# Patient Record
Sex: Female | Born: 1940 | Race: White | Hispanic: No | Marital: Married | Smoking: Former smoker
Health system: Southern US, Community
[De-identification: ages and names within clinical notes are randomized; demographics above are authoritative.]

## PROBLEM LIST (undated history)

## (undated) DIAGNOSIS — F329 Major depressive disorder, single episode, unspecified: Secondary | ICD-10-CM

## (undated) DIAGNOSIS — R011 Cardiac murmur, unspecified: Secondary | ICD-10-CM

## (undated) DIAGNOSIS — I499 Cardiac arrhythmia, unspecified: Secondary | ICD-10-CM

## (undated) DIAGNOSIS — E119 Type 2 diabetes mellitus without complications: Secondary | ICD-10-CM

## (undated) DIAGNOSIS — M1990 Unspecified osteoarthritis, unspecified site: Secondary | ICD-10-CM

## (undated) DIAGNOSIS — I4891 Unspecified atrial fibrillation: Secondary | ICD-10-CM

## (undated) DIAGNOSIS — F32A Depression, unspecified: Secondary | ICD-10-CM

## (undated) DIAGNOSIS — K589 Irritable bowel syndrome without diarrhea: Secondary | ICD-10-CM

## (undated) DIAGNOSIS — IMO0001 Reserved for inherently not codable concepts without codable children: Secondary | ICD-10-CM

## (undated) DIAGNOSIS — I10 Essential (primary) hypertension: Secondary | ICD-10-CM

## (undated) DIAGNOSIS — I2510 Atherosclerotic heart disease of native coronary artery without angina pectoris: Secondary | ICD-10-CM

## (undated) HISTORY — PX: JOINT REPLACEMENT: SHX530

## (undated) HISTORY — PX: SHOULDER ARTHROSCOPY WITH ROTATOR CUFF REPAIR: SHX5685

## (undated) HISTORY — PX: CORONARY ANGIOPLASTY: SHX604

## (undated) HISTORY — PX: EYE SURGERY: SHX253

---

## 2003-10-27 ENCOUNTER — Other Ambulatory Visit: Payer: Self-pay

## 2003-10-30 ENCOUNTER — Other Ambulatory Visit: Payer: Self-pay

## 2003-11-12 ENCOUNTER — Other Ambulatory Visit: Payer: Self-pay

## 2004-03-22 ENCOUNTER — Ambulatory Visit: Payer: Self-pay | Admitting: Family Medicine

## 2004-03-25 ENCOUNTER — Ambulatory Visit: Payer: Self-pay | Admitting: Family Medicine

## 2004-08-12 ENCOUNTER — Ambulatory Visit: Payer: Self-pay | Admitting: Family Medicine

## 2004-10-14 ENCOUNTER — Ambulatory Visit: Payer: Self-pay | Admitting: General Practice

## 2004-11-19 ENCOUNTER — Other Ambulatory Visit: Payer: Self-pay

## 2004-11-22 ENCOUNTER — Ambulatory Visit: Payer: Self-pay | Admitting: General Practice

## 2005-08-22 ENCOUNTER — Ambulatory Visit: Payer: Self-pay | Admitting: Urology

## 2005-09-05 ENCOUNTER — Ambulatory Visit: Payer: Self-pay | Admitting: Family Medicine

## 2006-09-14 ENCOUNTER — Ambulatory Visit: Payer: Self-pay | Admitting: Family Medicine

## 2007-09-18 ENCOUNTER — Ambulatory Visit: Payer: Self-pay | Admitting: Family Medicine

## 2008-01-04 ENCOUNTER — Ambulatory Visit: Payer: Self-pay | Admitting: Family Medicine

## 2008-09-23 ENCOUNTER — Ambulatory Visit: Payer: Self-pay | Admitting: Family Medicine

## 2009-10-16 ENCOUNTER — Ambulatory Visit: Payer: Self-pay | Admitting: Unknown Physician Specialty

## 2009-11-26 ENCOUNTER — Ambulatory Visit: Payer: Self-pay | Admitting: Family Medicine

## 2010-01-14 ENCOUNTER — Ambulatory Visit: Payer: Self-pay | Admitting: Unknown Physician Specialty

## 2010-01-20 ENCOUNTER — Ambulatory Visit: Payer: Self-pay | Admitting: Cardiovascular Disease

## 2010-01-21 ENCOUNTER — Ambulatory Visit: Payer: Self-pay | Admitting: Unknown Physician Specialty

## 2010-04-29 ENCOUNTER — Other Ambulatory Visit: Payer: Self-pay

## 2010-12-14 ENCOUNTER — Ambulatory Visit: Payer: Self-pay | Admitting: Family Medicine

## 2011-07-28 ENCOUNTER — Ambulatory Visit: Payer: Self-pay | Admitting: Unknown Physician Specialty

## 2011-08-15 ENCOUNTER — Ambulatory Visit: Payer: Self-pay | Admitting: Unknown Physician Specialty

## 2011-08-22 LAB — PATHOLOGY REPORT

## 2011-12-19 ENCOUNTER — Ambulatory Visit: Payer: Self-pay | Admitting: Family Medicine

## 2012-02-02 ENCOUNTER — Ambulatory Visit: Payer: Self-pay | Admitting: Unknown Physician Specialty

## 2012-02-17 ENCOUNTER — Ambulatory Visit: Payer: Self-pay | Admitting: Unknown Physician Specialty

## 2012-07-03 ENCOUNTER — Ambulatory Visit: Payer: Self-pay | Admitting: Specialist

## 2012-11-14 ENCOUNTER — Ambulatory Visit: Payer: Self-pay | Admitting: Specialist

## 2012-12-19 ENCOUNTER — Ambulatory Visit: Payer: Self-pay | Admitting: Family Medicine

## 2013-05-20 ENCOUNTER — Ambulatory Visit: Payer: Self-pay | Admitting: Specialist

## 2013-06-05 ENCOUNTER — Ambulatory Visit: Payer: Self-pay | Admitting: General Practice

## 2013-06-05 LAB — CBC
HCT: 42.5 % (ref 35.0–47.0)
HGB: 13.9 g/dL (ref 12.0–16.0)
MCH: 27.9 pg (ref 26.0–34.0)
MCHC: 32.8 g/dL (ref 32.0–36.0)
MCV: 85 fL (ref 80–100)
Platelet: 251 10*3/uL (ref 150–440)
RBC: 4.99 10*6/uL (ref 3.80–5.20)
RDW: 16.2 % — ABNORMAL HIGH (ref 11.5–14.5)
WBC: 11.7 10*3/uL — ABNORMAL HIGH (ref 3.6–11.0)

## 2013-06-05 LAB — SEDIMENTATION RATE: ERYTHROCYTE SED RATE: 15 mm/h (ref 0–30)

## 2013-06-05 LAB — URINALYSIS, COMPLETE
BLOOD: NEGATIVE
Bacteria: NONE SEEN
Bilirubin,UR: NEGATIVE
Glucose,UR: NEGATIVE mg/dL (ref 0–75)
Ketone: NEGATIVE
LEUKOCYTE ESTERASE: NEGATIVE
Nitrite: NEGATIVE
Ph: 7 (ref 4.5–8.0)
Protein: NEGATIVE
RBC, UR: NONE SEEN /HPF (ref 0–5)
SQUAMOUS EPITHELIAL: NONE SEEN
Specific Gravity: 1.009 (ref 1.003–1.030)

## 2013-06-05 LAB — PROTIME-INR
INR: 2
Prothrombin Time: 22.6 secs — ABNORMAL HIGH (ref 11.5–14.7)

## 2013-06-05 LAB — BASIC METABOLIC PANEL
Anion Gap: 4 — ABNORMAL LOW (ref 7–16)
BUN: 14 mg/dL (ref 7–18)
CALCIUM: 9.5 mg/dL (ref 8.5–10.1)
CO2: 29 mmol/L (ref 21–32)
Chloride: 101 mmol/L (ref 98–107)
Creatinine: 0.8 mg/dL (ref 0.60–1.30)
Glucose: 99 mg/dL (ref 65–99)
Osmolality: 269 (ref 275–301)
Potassium: 4.3 mmol/L (ref 3.5–5.1)
Sodium: 134 mmol/L — ABNORMAL LOW (ref 136–145)

## 2013-06-05 LAB — MRSA PCR SCREENING

## 2013-06-05 LAB — APTT: Activated PTT: 44.1 secs — ABNORMAL HIGH (ref 23.6–35.9)

## 2013-06-07 LAB — URINE CULTURE

## 2013-06-19 ENCOUNTER — Inpatient Hospital Stay: Payer: Self-pay | Admitting: General Practice

## 2013-06-19 LAB — PROTIME-INR
INR: 1.2
Prothrombin Time: 15.2 secs — ABNORMAL HIGH (ref 11.5–14.7)

## 2013-06-20 LAB — PLATELET COUNT: Platelet: 156 10*3/uL (ref 150–440)

## 2013-06-20 LAB — PROTIME-INR
INR: 1.3
PROTHROMBIN TIME: 15.9 s — AB (ref 11.5–14.7)

## 2013-06-20 LAB — BASIC METABOLIC PANEL
Anion Gap: 2 — ABNORMAL LOW (ref 7–16)
BUN: 12 mg/dL (ref 7–18)
CALCIUM: 8.2 mg/dL — AB (ref 8.5–10.1)
CO2: 26 mmol/L (ref 21–32)
Chloride: 104 mmol/L (ref 98–107)
Creatinine: 0.8 mg/dL (ref 0.60–1.30)
EGFR (African American): >60
EGFR (Non-African Amer.): >60
Glucose: 97 mg/dL (ref 65–99)
Osmolality: 264 (ref 275–301)
Potassium: 4 mmol/L (ref 3.5–5.1)
SODIUM: 132 mmol/L — AB (ref 136–145)

## 2013-06-20 LAB — HEMOGLOBIN: HGB: 11.8 g/dL — ABNORMAL LOW (ref 12.0–16.0)

## 2013-06-21 LAB — PROTIME-INR
INR: 2.4
Prothrombin Time: 25.8 secs — ABNORMAL HIGH (ref 11.5–14.7)

## 2013-06-21 LAB — BASIC METABOLIC PANEL
Anion Gap: 5 — ABNORMAL LOW (ref 7–16)
BUN: 11 mg/dL (ref 7–18)
CO2: 24 mmol/L (ref 21–32)
CREATININE: 0.88 mg/dL (ref 0.60–1.30)
Calcium, Total: 8.4 mg/dL — ABNORMAL LOW (ref 8.5–10.1)
Chloride: 102 mmol/L (ref 98–107)
EGFR (African American): >60
GLUCOSE: 119 mg/dL — AB (ref 65–99)
OSMOLALITY: 263 (ref 275–301)
Potassium: 4.3 mmol/L (ref 3.5–5.1)
Sodium: 131 mmol/L — ABNORMAL LOW (ref 136–145)

## 2013-06-21 LAB — PLATELET COUNT: PLATELETS: 158 10*3/uL (ref 150–440)

## 2013-06-21 LAB — HEMOGLOBIN: HGB: 11.3 g/dL — ABNORMAL LOW (ref 12.0–16.0)

## 2013-06-22 LAB — BASIC METABOLIC PANEL
Anion Gap: 4 — ABNORMAL LOW (ref 7–16)
BUN: 9 mg/dL (ref 7–18)
CALCIUM: 9.2 mg/dL (ref 8.5–10.1)
CO2: 29 mmol/L (ref 21–32)
Chloride: 102 mmol/L (ref 98–107)
Creatinine: 0.77 mg/dL (ref 0.60–1.30)
EGFR (Non-African Amer.): >60
Glucose: 107 mg/dL — ABNORMAL HIGH (ref 65–99)
OSMOLALITY: 269 (ref 275–301)
POTASSIUM: 3.5 mmol/L (ref 3.5–5.1)
Sodium: 135 mmol/L — ABNORMAL LOW (ref 136–145)

## 2013-06-22 LAB — URINALYSIS, COMPLETE
BLOOD: NEGATIVE
Bacteria: NONE SEEN
Bilirubin,UR: NEGATIVE
Glucose,UR: NEGATIVE mg/dL (ref 0–75)
Ketone: NEGATIVE
Leukocyte Esterase: NEGATIVE
Nitrite: NEGATIVE
Ph: 7 (ref 4.5–8.0)
Protein: NEGATIVE
Specific Gravity: 1.004 (ref 1.003–1.030)
Squamous Epithelial: <1

## 2013-06-22 LAB — CK-MB
CK-MB: 2 ng/mL (ref 0.5–3.6)
CK-MB: 1.6 ng/mL (ref 0.5–3.6)
CK-MB: 1.5 ng/mL (ref 0.5–3.6)

## 2013-06-22 LAB — TROPONIN I
Troponin-I: 0.37 ng/mL — ABNORMAL HIGH
Troponin-I: 0.38 ng/mL — ABNORMAL HIGH
Troponin-I: 0.38 ng/mL — ABNORMAL HIGH

## 2013-06-22 LAB — PROTIME-INR
INR: 2.8
Prothrombin Time: 29.1 secs — ABNORMAL HIGH (ref 11.5–14.7)

## 2013-06-23 LAB — PROTIME-INR
INR: 2.5
PROTHROMBIN TIME: 26.4 s — AB (ref 11.5–14.7)

## 2013-06-23 LAB — BASIC METABOLIC PANEL
Anion Gap: 4 — ABNORMAL LOW (ref 7–16)
BUN: 11 mg/dL (ref 7–18)
CALCIUM: 9 mg/dL (ref 8.5–10.1)
CREATININE: 0.8 mg/dL (ref 0.60–1.30)
Chloride: 98 mmol/L (ref 98–107)
Co2: 34 mmol/L — ABNORMAL HIGH (ref 21–32)
EGFR (African American): >60
Glucose: 99 mg/dL (ref 65–99)
Osmolality: 271 (ref 275–301)
POTASSIUM: 3.4 mmol/L — AB (ref 3.5–5.1)
Sodium: 136 mmol/L (ref 136–145)

## 2013-06-23 LAB — URINE CULTURE

## 2013-12-23 ENCOUNTER — Ambulatory Visit: Payer: Self-pay | Admitting: Family Medicine

## 2014-01-23 DIAGNOSIS — R918 Other nonspecific abnormal finding of lung field: Secondary | ICD-10-CM | POA: Insufficient documentation

## 2014-02-12 ENCOUNTER — Ambulatory Visit: Payer: Self-pay | Admitting: Specialist

## 2014-02-18 DIAGNOSIS — I2510 Atherosclerotic heart disease of native coronary artery without angina pectoris: Secondary | ICD-10-CM | POA: Insufficient documentation

## 2014-02-18 DIAGNOSIS — I10 Essential (primary) hypertension: Secondary | ICD-10-CM | POA: Insufficient documentation

## 2014-02-18 DIAGNOSIS — E785 Hyperlipidemia, unspecified: Secondary | ICD-10-CM | POA: Insufficient documentation

## 2014-02-18 DIAGNOSIS — I4891 Unspecified atrial fibrillation: Secondary | ICD-10-CM | POA: Insufficient documentation

## 2014-03-19 DIAGNOSIS — E119 Type 2 diabetes mellitus without complications: Secondary | ICD-10-CM | POA: Insufficient documentation

## 2014-09-01 DIAGNOSIS — Z8659 Personal history of other mental and behavioral disorders: Secondary | ICD-10-CM | POA: Insufficient documentation

## 2014-09-13 NOTE — Op Note (Signed)
PATIENT NAME:  [name] [name], [name] [name] MR#:  [id number] DATE OF BIRTH:  10-14-1940  DATE OF PROCEDURE:  06/19/2013  PREOPERATIVE DIAGNOSIS: Degenerative arthrosis of the right knee.   POSTOPERATIVE DIAGNOSIS: Degenerative arthrosis of the right knee.   PROCEDURE PERFORMED: Right total knee arthroplasty using computer-assisted navigation.   SURGEON: Laurice Record. Hooten, M.D.   ASSISTANT:  Vance Peper, PA (required to maintain retraction throughout the procedure).   ANESTHESIA: Spinal.   ESTIMATED BLOOD LOSS: 50 mL.   FLUIDS REPLACED: 2000 mL of crystalloid.   TOURNIQUET TIME: 88 minutes.   DRAINS: Two medium drains to reinfusion system.   SOFT TISSUE RELEASES: Anterior cruciate ligament, posterior cruciate ligament, deep medial collateral ligament, patellofemoral ligament and posterolateral corner.   IMPLANTS UTILIZED: DePuy PFC Sigma size 4N (narrow) posterior stabilized femoral component (cemented), size 3 MBT tibial component (cemented), 35 mm 3 peg oval dome patella (cemented) and a 10 mm stabilized rotating platform polyethylene insert.   INDICATIONS FOR SURGERY: The patient is a 74 year old female who has been seen for complaints of progressive right knee pain. X-rays demonstrated severe degenerative changes in tricompartmental fashion with relative valgus deformity. After discussion of the risks and benefits of surgical intervention, the patient expressed understanding of the risks, benefits and agreed with plans for surgical intervention.   PROCEDURE IN DETAIL: The patient was brought to the operating room, and after adequate spinal anesthesia was achieved, a tourniquet was placed on the patient's upper right thigh. The patient's right knee and leg were cleaned and prepped with alcohol and DuraPrep and draped in the usual sterile fashion. A "timeout" was performed as per usual protocol. The right lower extremity was exsanguinated using an Esmarch, and the tourniquet was inflated to 300 mmHg. An  anterior longitudinal incision was made followed by a standard mid vastus approach. A small effusion was evacuated. The deep fibers of the medial collateral ligament were elevated in a subperiosteal fashion off the medial flare of the tibia so as to maintain a continuous soft tissue sleeve. The patella was subluxed laterally and the patellofemoral ligament was incised. Inspection of the knee demonstrates severe degenerative changes, most notably to the lateral compartment, as well as to the patellofemoral articulation. Prominent osteophytes were debrided using a rongeur. Anterior and posterior cruciate ligaments were excised. Two 4.0 mm Schanz pins were inserted into the femur and into the tibia for attachment of the ray of trackers used for computer-assisted navigation. Hip center was identified using circumduction technique. Distal landmarks were mapped using the computer. The distal femur and proximal tibia were mapped using the computer. Distal femoral cutting guide was positioned using computer-assisted navigation so as to achieve a 5-degree distal valgus cut. Cut was performed and verified using the computer. Distal femur was sized and it was felt that a size 4N (narrow) was appropriate. A size 4 cutting guide was positioned, and the anterior cut was performed and verified using the computer. This was followed by completion of the posterior and chamfer cuts. Femoral cutting guide for central box was then positioned, and the central box cut was performed. Attention was then directed to the proximal tibia. Medial and lateral menisci were excised. The extramedullary tibial cutting guide was positioned using computer-assisted navigation so as to achieve a 0-degree varus/valgus alignment with 0 degree posterior slope. Cut was performed and verified using the computer. The proximal tibia was sized and it was felt that a size 3 tibial tray was appropriate. Tibial and femoral trials were inserted followed by insertion  of a 10 mm polyethylene insert. The knee was felt to be tight laterally. Trial components were removed. Posterior osteophytes were debrided off of the femur. The knee was brought into full extension and distracted using Moreland retractors. Posterolateral corner was carefully released using a combination of electrocautery and Metzenbaum scissors. This allowed for good medial and lateral soft tissue balancing. Trial components were reinserted following insertion of a 10 mm polyethylene insert. Medial and lateral soft tissue balance was appreciated in full extension and in flexion. Finally, the patella was cut and repaired so as to accommodate a 35 mm 3 peg oval dome patella. Patellar trial was placed, and the knee was placed through a range of motion with excellent patellar tracking appreciated. The femoral trial was removed. Central post hole for the tibial component was reamed followed by insertion of a keel punch. Tibial trial was then removed. The cut surfaces of bone were irrigated with copious amounts of normal saline with antibiotic solution using pulsatile lavage and then suctioned dry. Polymethylmethacrylate cement with gentamicin was prepared in the usual fashion using a vacuum mixer. Gentamicin cement was utilized due to the patient's history of diabetes. Cement was applied to the cut surface of the proximal tibia, as well as along the undersurface of a size 3 MBT tibial component. The tibial component was positioned and impacted into place. Excess cement was removed using [employer] fast streamer. Cement was then applied to the cut surfaces of the femur, as well as along the posterior flanges of a size 4N (narrow) posterior stabilized femoral component. Femoral component was positioned and impacted into place. Excess cement was removed using [employer] fast streamer. A 10 mm polyethylene trial was inserted, and the knee was brought in full extension with steady axial compression applied. Finally, a 35 mm 3 peg oval dome  patella was positioned after placement of cement along the backside of the patella. A patellar clamp was applied. Excess cement was removed using [employer] fast streamer.   After adequate curing of cement, the tourniquet was deflated after a total tourniquet time of 88 minutes. Hemostasis was achieved using electrocautery. The knee was irrigated with copious amounts of normal saline with antibiotic solution using pulsatile lavage and then suctioned dry. The knee was inspected for any residual cement debris. Then, 20 mL of 1.3% Exparel and 40 mL of normal saline was injected along the posterior capsule, medial and lateral gutters  and along the arthrotomy site. A 10 mm stabilized rotating platform polyethylene insert was inserted. Again, excellent medial and lateral soft tissue balancing was appreciated in extension and in flexion. Excellent patellar tracking was appreciated. Two medium drains were placed in the wound bed and brought out through stab incisions to be attached to a reinfusion system. The medial parapatellar portion of the incision was reapproximated using interrupted sutures of #1 Vicryl. The subcutaneous tissue was approximated in layers using first #0 Vicryl followed by 2-0 Vicryl. Then, 30 mL of 0.25% Marcaine with epinephrine was injected in the subcutaneous tissue along the incision line. Skin was then closed with skin staples. A sterile dressing was applied.   The patient tolerated the procedure well. She was transported to the recovery room in stable condition.     ____________________________ Laurice Record. Holley Bouche., MD jph:dmm D: 06/19/2013 11:25:44 ET T: 06/19/2013 11:48:03 ET JOB#: [id number]  cc: Jeneen Rinks P. Holley Bouche., MD, <Dictator> Laurice Record Holley Bouche MD ELECTRONICALLY SIGNED 07/12/2013 6:39

## 2014-09-13 NOTE — Consult Note (Signed)
PATIENT NAME:  [name] [name], [name] [name] MR#:  [id number] DATE OF BIRTH:  [birth year]  DATE OF CONSULTATION:  06/22/2013  REFERRING PHYSICIAN:  Laurice Record. Holley Bouche., MD CONSULTING PHYSICIAN:  Tama High III, MD  REASON FOR CONSULTATION: Hypoxia.   HISTORY OF PRESENT ILLNESS: A 74 year old female with history of coronary artery disease, atrial fibrillation, hypertension, hyperlipidemia, who underwent right knee replacement on 06/19/2013. She has done well postoperatively, with some pain and confusion that has resolved, really not requiring a lot of pain medication currently. Today, she was noted to have hypoxia, and attempts were made to take her off oxygen; however, she needed to return to this device, and so consultation is made for further evaluation. The patient reports that she has episodes of shortness of breath, usually with exertion, usually improves after 15 to 20 minutes of sitting. She had one of these episodes a few days ago, but it does not appear that she told anyone. She uses an inhaler at home, but denies any recent wheezing. Review of her chart shows pulmonary function tests were performed in October 2013, which were normal. She also had stress testing done last year. She has some palpitations normally due to her atrial fibrillation, but there has been no worsening of this. She denies significant chest pain. Does have some edema and some discomfort in her leg, but not severe. She is talking easily in complete sentences currently.   PAST MEDICAL HISTORY:  1. Hypertension.  2. Atrial fibrillation.  3. Coronary artery disease with prior LAD stenting in 2002. 4. Hyperlipidemia.  5. Osteoarthritis.  6. Anxiety with depression.  7. Irritable bowel syndrome.  8. Gastroesophageal reflux disease with history of H. pylori.  9. Diabetes mellitus.  10. History of urethral stricture.  11. Bilateral tubal ligations.  12. History of cataract repair.  13. History of rotator cuff surgery.  14. Right  knee replacement on 06/19/2013.   ALLERGIES: TETRACYCLINE, FLAGYL, PHENERGAN, SEPTRA, STADOL, SULFA MEDICATIONS, VICODIN AND ROCEPHIN.   HOME MEDICATIONS:   1. Crestor 10 mg p.o. daily.  2. Cardizem CD 240 mg p.o. daily.  3. Albuterol MDI 2 puffs q.6 hours as needed.  4. Coumadin 5 mg p.o. daily.  5. Omeprazole 20 mg p.o. daily.  6. Ambien 10 mg p.o. at bedtime as needed for sleep.  7. Valium 5 mg p.o. daily as needed.  8. Digoxin 0.125 mg 1/2-tablet p.o. daily.  9. Lasix 20 mg p.o. daily.  10. Lexapro 20 mg p.o. daily.  11. Potassium 20 mEq p.o. daily.   SOCIAL HISTORY: No alcohol or tobacco.   FAMILY HISTORY: Diabetes, hypertension.   REVIEW OF SYSTEMS: Please see HPI. No vision changes; no dysphagia; no current chest pain; no abdominal discomfort. Remainder of complete review of systems is negative.   PHYSICAL EXAMINATION:  VITAL SIGNS: Temperature 99.4, pulse 105, blood pressure 145/82, saturation 91% on 2 liters.  GENERAL: Well-developed, well-nourished female, in no distress.  EYES: Pupils round and reactive to light. Lids and conjunctivae are unremarkable.  EARS, NOSE AND THROAT: Unremarkable. The oropharynx is moist without lesions.  NECK: Supple. Trachea midline. No thyromegaly.  CARDIOVASCULAR: Irregularly irregular without gallops or rubs. Carotid and radial pulses 2+.  LUNGS: Crackles are heard in the lower lung fields bilaterally, without wheeze or retractions. Respiratory rate 12.  ABDOMEN: Soft, nontender, nondistended. Positive bowel sounds. No guard or rebound.  EXTREMITIES: Postoperative changes in the right knee. 1+ edema in the right lower leg with negative Homans. No significant edema in  the left leg.  NEUROLOGIC: Cranial nerves intact. Motor strength appears to be symmetrical except as limited by the recent right leg surgery.   DATA: Chest x-ray reveals cardiomegaly with pulmonary vascular congestion. BUN 9, creatinine 0.77, glucose 107, sodium 135. INR is  2.8. Hemoglobin 11.3.   IMPRESSION AND PLAN:  1. Hypoxia. Appears to be most consistent with some pulmonary edema. Would get CT scan to rule out pulmonary embolism given recent surgery; however, this appears to be less likely given her resumption of anticoagulation, which is at target. This should confirm presence of any pulmonary edema or any other source of pathology which would be interfering with her oxygen levels. Will increase diuretic. Would get echo to determine whether LV function is reduced. Had stress testing in 2014; however, if she has any further episodes or worsening, may need to consider repeating this. Recheck her cardiac enzymes. Follow electrolytes.  2. Hypertension. Borderline currently, will see how she does with diuresis, consideration for titration upward of the Cardizem. Consideration for ACE or ARB pending results of the echocardiogram.  3. Atrial fibrillation, rate controlled. Continue Coumadin.  4. Dyspepsia. Continue proton pump inhibitors. This appears to be stable. 5. Diabetes mellitus. Coverage with sliding scale if needed.   Thank you for allowing me to participate in the care of Ms. Mistry, and I will follow with you during this hospitalization.   ____________________________ Adin Hector, MD bjk:lb D: 06/22/2013 11:37:08 ET T: 06/22/2013 11:59:44 ET JOB#: [id number]  cc: Tama High III, MD, <Dictator> Ramonita Lab MD ELECTRONICALLY SIGNED 06/24/2013 20:44

## 2014-09-13 NOTE — Consult Note (Signed)
Brief Consult Note: Diagnosis: pulmonary edema.   Patient was seen by consultant.   Consult note dictated.   Orders entered.   Discussed with Attending MD.   Comments: Exam with bibasilar crackles; CXR suggests edema.  Given surgery, would get CT to r/o PE, but fairly low likelihood of this, given current INR.  Check echo to eval EF; increase diuretic.  Electronic Signatures: Tama High (MD)  (Signed 31-Jan-15 11:30)  Authored: Brief Consult Note   Last Updated: 31-Jan-15 11:30 by Tama High (MD)

## 2014-09-13 NOTE — Discharge Summary (Signed)
PATIENT NAME:  [name], [name] MR#:  [id number] DATE OF BIRTH:  [birth year]  DATE OF ADMISSION:  06/19/2013 DATE OF DISCHARGE:  06/23/2013  OPERATION: The patient on 06/19/2013, had a right total knee arthroplasty using computer-aided navigation.   PREOPERATIVE DIAGNOSIS:  Degenerative arthrosis of the right knee.   DISCHARGE DIAGNOSIS: Degenerative arthritis of the right knee.   SURGEON: Skip Estimable, M.D.   ASSISTANT: Vance Peper, PA.   ANESTHESIA: Spinal.   ESTIMATED BLOOD LOSS: 50 mL  REPLACEMENT:  2000 mL of crystalloid.  IMPLANTS USED:  DePuy PFC Sigma size 4 narrow, posterior stabilized femoral component (cemented), size 3 MBT tibial component (cemented), 35 mm 3-peg oval dome patella that was cemented, a 10 mm stabilized rotating platform polyethylene insert.   HISTORY OF PRESENT ILLNESS:  The patient is a 74 year old female who presented for an upcoming total knee replacement on the right knee. The patient has a long history of progressive pain and deformity of the knee. The patient has been refractory to conservative treatment. The patient is on Coumadin presently.   PHYSICAL EXAMINATION: GENERAL: Alert female with an antalgic gait and varus thrust to the right knee.  LUNGS: Clear to auscultation.  CARDIOVASCULAR: Irregular rate and rhythm with no murmur.  MUSCULOSKELETAL: In regard to the right knee, the patient does have positive soft tissue swelling. The patient has medial and lateral joint line tenderness. The patient has full extension of 112 degrees flexion.  NEUROLOGIC: Essentially intact.   HOSPITAL COURSE: The patient after initial admission after surgery had her INRs followed. It was initially 1.2 and her hemoglobin was 11.3, which remained stable there. The patient did physical therapy and did well with ambulation, ambulating initially bed to chair and progressing up to ambulating around the nurse's station. The patient was able to do straight leg raise.   DISCHARGE  INSTRUCTIONS: The patient will do weight bear as tolerated on the affected leg. The patient to use thigh-high TED hose on both legs, remove at bedtime. The patient will elevate her heels off the bed and be encouraged to do cough and deep breathing. The patient will use a regular diet. The patient will use Polar Care to decrease swelling and not to get her dressing wet or dirty and try to leave it on. The patient will have it changed on a p.r.n. basis with therapy. The patient will do physical therapy at home doing gait training and range of motion activities. The patient will call the clinic if there is any bright red bleeding, calf pain or bowel/bladder difficulty and any fever greater than 101.5. The patient is to follow up at [facility] on 07/04/2013.   DISCHARGE MEDICATIONS: The patient is to resume home medications and to add oxycodone for breakthrough pain.  ____________________________ J. Reche Dixon, [place] jtm:ce D: 07/05/2013 14:38:41 ET T: 07/05/2013 15:25:14 ET JOB#: [id number]  cc: J. Reche Dixon, [place], <Dictator> J Ja Pistole [facility] PA ELECTRONICALLY SIGNED 07/11/2013 7:13

## 2015-01-01 ENCOUNTER — Encounter: Payer: Self-pay | Admitting: *Deleted

## 2015-01-01 DIAGNOSIS — R002 Palpitations: Secondary | ICD-10-CM | POA: Diagnosis not present

## 2015-01-01 DIAGNOSIS — M8580 Other specified disorders of bone density and structure, unspecified site: Secondary | ICD-10-CM | POA: Diagnosis not present

## 2015-01-01 DIAGNOSIS — Z87891 Personal history of nicotine dependence: Secondary | ICD-10-CM | POA: Diagnosis not present

## 2015-01-01 DIAGNOSIS — Z955 Presence of coronary angioplasty implant and graft: Secondary | ICD-10-CM | POA: Diagnosis not present

## 2015-01-01 DIAGNOSIS — Z881 Allergy status to other antibiotic agents status: Secondary | ICD-10-CM | POA: Diagnosis not present

## 2015-01-01 DIAGNOSIS — I2510 Atherosclerotic heart disease of native coronary artery without angina pectoris: Secondary | ICD-10-CM | POA: Diagnosis not present

## 2015-01-01 DIAGNOSIS — R062 Wheezing: Secondary | ICD-10-CM | POA: Diagnosis not present

## 2015-01-01 DIAGNOSIS — I4891 Unspecified atrial fibrillation: Secondary | ICD-10-CM | POA: Diagnosis not present

## 2015-01-01 DIAGNOSIS — Z888 Allergy status to other drugs, medicaments and biological substances status: Secondary | ICD-10-CM | POA: Diagnosis not present

## 2015-01-01 DIAGNOSIS — E119 Type 2 diabetes mellitus without complications: Secondary | ICD-10-CM | POA: Diagnosis not present

## 2015-01-01 DIAGNOSIS — R011 Cardiac murmur, unspecified: Secondary | ICD-10-CM | POA: Diagnosis not present

## 2015-01-01 DIAGNOSIS — F329 Major depressive disorder, single episode, unspecified: Secondary | ICD-10-CM | POA: Diagnosis not present

## 2015-01-01 DIAGNOSIS — H2511 Age-related nuclear cataract, right eye: Secondary | ICD-10-CM | POA: Diagnosis present

## 2015-01-01 DIAGNOSIS — Z9842 Cataract extraction status, left eye: Secondary | ICD-10-CM | POA: Diagnosis not present

## 2015-01-01 DIAGNOSIS — M1990 Unspecified osteoarthritis, unspecified site: Secondary | ICD-10-CM | POA: Diagnosis not present

## 2015-01-01 DIAGNOSIS — M7989 Other specified soft tissue disorders: Secondary | ICD-10-CM | POA: Diagnosis not present

## 2015-01-01 DIAGNOSIS — K589 Irritable bowel syndrome without diarrhea: Secondary | ICD-10-CM | POA: Diagnosis not present

## 2015-01-01 DIAGNOSIS — R0602 Shortness of breath: Secondary | ICD-10-CM | POA: Diagnosis not present

## 2015-01-01 DIAGNOSIS — I10 Essential (primary) hypertension: Secondary | ICD-10-CM | POA: Diagnosis not present

## 2015-01-01 DIAGNOSIS — E780 Pure hypercholesterolemia: Secondary | ICD-10-CM | POA: Diagnosis not present

## 2015-01-01 DIAGNOSIS — Z96651 Presence of right artificial knee joint: Secondary | ICD-10-CM | POA: Diagnosis not present

## 2015-01-13 ENCOUNTER — Ambulatory Visit: Payer: Commercial Managed Care - HMO | Admitting: Anesthesiology

## 2015-01-13 ENCOUNTER — Ambulatory Visit
Admission: RE | Admit: 2015-01-13 | Discharge: 2015-01-13 | Disposition: A | Discharge status: Home / Self Care | Payer: Commercial Managed Care - HMO | Source: Ambulatory Visit | Attending: Ophthalmology | Admitting: Ophthalmology

## 2015-01-13 ENCOUNTER — Encounter: Payer: Self-pay | Admitting: *Deleted

## 2015-01-13 ENCOUNTER — Encounter
Admission: RE | Disposition: A | Discharge status: Home / Self Care | Payer: Self-pay | Source: Ambulatory Visit | Attending: Ophthalmology

## 2015-01-13 DIAGNOSIS — R011 Cardiac murmur, unspecified: Secondary | ICD-10-CM | POA: Insufficient documentation

## 2015-01-13 DIAGNOSIS — I4891 Unspecified atrial fibrillation: Secondary | ICD-10-CM | POA: Insufficient documentation

## 2015-01-13 DIAGNOSIS — Z87891 Personal history of nicotine dependence: Secondary | ICD-10-CM | POA: Insufficient documentation

## 2015-01-13 DIAGNOSIS — M7989 Other specified soft tissue disorders: Secondary | ICD-10-CM | POA: Insufficient documentation

## 2015-01-13 DIAGNOSIS — Z881 Allergy status to other antibiotic agents status: Secondary | ICD-10-CM | POA: Insufficient documentation

## 2015-01-13 DIAGNOSIS — Z955 Presence of coronary angioplasty implant and graft: Secondary | ICD-10-CM | POA: Insufficient documentation

## 2015-01-13 DIAGNOSIS — H2511 Age-related nuclear cataract, right eye: Secondary | ICD-10-CM | POA: Insufficient documentation

## 2015-01-13 DIAGNOSIS — M1990 Unspecified osteoarthritis, unspecified site: Secondary | ICD-10-CM | POA: Insufficient documentation

## 2015-01-13 DIAGNOSIS — E780 Pure hypercholesterolemia: Secondary | ICD-10-CM | POA: Insufficient documentation

## 2015-01-13 DIAGNOSIS — Z9842 Cataract extraction status, left eye: Secondary | ICD-10-CM | POA: Insufficient documentation

## 2015-01-13 DIAGNOSIS — R062 Wheezing: Secondary | ICD-10-CM | POA: Insufficient documentation

## 2015-01-13 DIAGNOSIS — M8580 Other specified disorders of bone density and structure, unspecified site: Secondary | ICD-10-CM | POA: Insufficient documentation

## 2015-01-13 DIAGNOSIS — Z888 Allergy status to other drugs, medicaments and biological substances status: Secondary | ICD-10-CM | POA: Insufficient documentation

## 2015-01-13 DIAGNOSIS — F329 Major depressive disorder, single episode, unspecified: Secondary | ICD-10-CM | POA: Insufficient documentation

## 2015-01-13 DIAGNOSIS — E119 Type 2 diabetes mellitus without complications: Secondary | ICD-10-CM | POA: Insufficient documentation

## 2015-01-13 DIAGNOSIS — R0602 Shortness of breath: Secondary | ICD-10-CM | POA: Insufficient documentation

## 2015-01-13 DIAGNOSIS — I10 Essential (primary) hypertension: Secondary | ICD-10-CM | POA: Insufficient documentation

## 2015-01-13 DIAGNOSIS — R002 Palpitations: Secondary | ICD-10-CM | POA: Insufficient documentation

## 2015-01-13 DIAGNOSIS — Z96651 Presence of right artificial knee joint: Secondary | ICD-10-CM | POA: Insufficient documentation

## 2015-01-13 DIAGNOSIS — I2510 Atherosclerotic heart disease of native coronary artery without angina pectoris: Secondary | ICD-10-CM | POA: Insufficient documentation

## 2015-01-13 DIAGNOSIS — K589 Irritable bowel syndrome without diarrhea: Secondary | ICD-10-CM | POA: Insufficient documentation

## 2015-01-13 HISTORY — DX: Irritable bowel syndrome without diarrhea: K58.9

## 2015-01-13 HISTORY — DX: Depression, unspecified: F32.A

## 2015-01-13 HISTORY — DX: Essential (primary) hypertension: I10

## 2015-01-13 HISTORY — DX: Atherosclerotic heart disease of native coronary artery without angina pectoris: I25.10

## 2015-01-13 HISTORY — DX: Reserved for inherently not codable concepts without codable children: IMO0001

## 2015-01-13 HISTORY — DX: Cardiac arrhythmia, unspecified: I49.9

## 2015-01-13 HISTORY — PX: CATARACT EXTRACTION W/PHACO: SHX586

## 2015-01-13 HISTORY — DX: Major depressive disorder, single episode, unspecified: F32.9

## 2015-01-13 HISTORY — DX: Cardiac murmur, unspecified: R01.1

## 2015-01-13 HISTORY — DX: Unspecified osteoarthritis, unspecified site: M19.90

## 2015-01-13 HISTORY — DX: Type 2 diabetes mellitus without complications: E11.9

## 2015-01-13 LAB — GLUCOSE, CAPILLARY: Glucose-Capillary: 119 mg/dL — ABNORMAL HIGH (ref 65–99)

## 2015-01-13 SURGERY — PHACOEMULSIFICATION, CATARACT, WITH IOL INSERTION
Anesthesia: Monitor Anesthesia Care | Site: Eye | Laterality: Right | Wound class: Clean

## 2015-01-13 MED ORDER — FENTANYL CITRATE (PF) 100 MCG/2ML IJ SOLN
INTRAMUSCULAR | Status: DC | PRN
  Administered 2015-01-13: 50 ug via INTRAVENOUS
  Administered 2015-01-13: 25 ug via INTRAVENOUS

## 2015-01-13 MED ORDER — MOXIFLOXACIN HCL 0.5 % OP SOLN
OPHTHALMIC | Status: DC | PRN
  Administered 2015-01-13: 1 [drp] via OPHTHALMIC

## 2015-01-13 MED ORDER — SODIUM CHLORIDE 0.9 % IV SOLN
INTRAVENOUS | Status: DC
  Administered 2015-01-13 (×2): via INTRAVENOUS

## 2015-01-13 MED ORDER — BSS IO SOLN
INTRAOCULAR | Status: DC | PRN
  Administered 2015-01-13: 09:00:00 via OPHTHALMIC

## 2015-01-13 MED ORDER — MIDAZOLAM HCL 2 MG/2ML IJ SOLN
INTRAMUSCULAR | Status: DC | PRN
  Administered 2015-01-13: 1 mg via INTRAVENOUS
  Administered 2015-01-13: .5 mg via INTRAVENOUS

## 2015-01-13 MED ORDER — TETRACAINE HCL 0.5 % OP SOLN
1.0000 [drp] | OPHTHALMIC | Status: AC | PRN
  Administered 2015-01-13: 1 [drp] via OPHTHALMIC

## 2015-01-13 MED ORDER — POVIDONE-IODINE 5 % OP SOLN
OPHTHALMIC | Status: AC
  Administered 2015-01-13: 1 via OPHTHALMIC
  Filled 2015-01-13: qty 30

## 2015-01-13 MED ORDER — CEFUROXIME OPHTHALMIC INJECTION 1 MG/0.1 ML
INJECTION | OPHTHALMIC | Status: DC | PRN
  Administered 2015-01-13: 0.1 mL via INTRACAMERAL

## 2015-01-13 MED ORDER — POVIDONE-IODINE 5 % OP SOLN
1.0000 "application " | OPHTHALMIC | Status: AC | PRN
  Administered 2015-01-13: 1 via OPHTHALMIC

## 2015-01-13 MED ORDER — MOXIFLOXACIN HCL 0.5 % OP SOLN
OPHTHALMIC | Status: AC
  Filled 2015-01-13: qty 3

## 2015-01-13 MED ORDER — ONDANSETRON HCL 4 MG/2ML IJ SOLN
INTRAMUSCULAR | Status: DC | PRN
  Administered 2015-01-13: 4 mg via INTRAVENOUS

## 2015-01-13 MED ORDER — ARMC OPHTHALMIC DILATING GEL
OPHTHALMIC | Status: AC
  Administered 2015-01-13: 1 via OPHTHALMIC
  Filled 2015-01-13: qty 0.25

## 2015-01-13 MED ORDER — TETRACAINE HCL 0.5 % OP SOLN
OPHTHALMIC | Status: AC
  Administered 2015-01-13: 1 [drp] via OPHTHALMIC
  Filled 2015-01-13: qty 2

## 2015-01-13 MED ORDER — NA CHONDROIT SULF-NA HYALURON 40-17 MG/ML IO SOLN
INTRAOCULAR | Status: DC | PRN
  Administered 2015-01-13: 1 mL via INTRAOCULAR

## 2015-01-13 MED ORDER — ARMC OPHTHALMIC DILATING GEL
1.0000 "application " | OPHTHALMIC | Status: DC | PRN
  Administered 2015-01-13: 1 via OPHTHALMIC

## 2015-01-13 SURGICAL SUPPLY — 22 items
CANNULA ANT/CHMB 27GA (MISCELLANEOUS) ×3 IMPLANT
CUP MEDICINE 2OZ PLAST GRAD ST (MISCELLANEOUS) ×3 IMPLANT
GLOVE BIO SURGEON STRL SZ8 (GLOVE) ×3 IMPLANT
GLOVE BIOGEL M 6.5 STRL (GLOVE) ×3 IMPLANT
GLOVE SURG LX 8.0 MICRO (GLOVE) ×2
GLOVE SURG LX STRL 8.0 MICRO (GLOVE) ×1 IMPLANT
GOWN STRL REUS W/ TWL LRG LVL3 (GOWN DISPOSABLE) ×2 IMPLANT
GOWN STRL REUS W/TWL LRG LVL3 (GOWN DISPOSABLE) ×4
LENS IOL TECNIS 20.0 (Intraocular Lens) ×3 IMPLANT
LENS IOL TECNIS MONO 1P 20.0 (Intraocular Lens) ×1 IMPLANT
PACK CATARACT (MISCELLANEOUS) ×3 IMPLANT
PACK CATARACT BRASINGTON LX (MISCELLANEOUS) ×3 IMPLANT
PACK EYE AFTER SURG (MISCELLANEOUS) ×3 IMPLANT
SOL BSS BAG (MISCELLANEOUS) ×3
SOL PREP PVP 2OZ (MISCELLANEOUS) ×3
SOLUTION BSS BAG (MISCELLANEOUS) ×1 IMPLANT
SOLUTION PREP PVP 2OZ (MISCELLANEOUS) ×1 IMPLANT
SYR 3ML LL SCALE MARK (SYRINGE) ×3 IMPLANT
SYR 5ML LL (SYRINGE) ×3 IMPLANT
SYR TB 1ML 27GX1/2 LL (SYRINGE) ×3 IMPLANT
WATER STERILE IRR 1000ML POUR (IV SOLUTION) ×3 IMPLANT
WIPE NON LINTING 3.25X3.25 (MISCELLANEOUS) ×3 IMPLANT

## 2015-01-13 NOTE — Anesthesia Procedure Notes (Addendum)
Procedure Name: MAC Date/Time: 01/13/2015 9:26 AM Performed by: Nelda Marseille Pre-anesthesia Checklist: Patient identified, Emergency Drugs available, Suction available, Patient being monitored and Timeout performed Oxygen Delivery Method: Nasal cannula   Performed by: Nelda Marseille

## 2015-01-13 NOTE — Transfer of Care (Signed)
Immediate Anesthesia Transfer of Care Note  Patient: [name] [name]  Procedure(s) Performed: Procedure(s) with comments: CATARACT EXTRACTION PHACO AND INTRAOCULAR LENS PLACEMENT (IOC) (Right) - US:01:15.6 AP:19.6 CDE:14.84 LOT CXKG #[id number] H  Patient Location: PACU  Anesthesia Type:MAC  Level of Consciousness: awake, alert , oriented and sedated  Airway & Oxygen Therapy: Patient Spontanous Breathing  Post-op Assessment: Report given to RN and Post -op Vital signs reviewed and stable  Post vital signs: Reviewed and stable  Last Vitals:  Filed Vitals:   01/13/15 0836  BP: 142/88  Pulse: 78  Temp: 36.2 C  Resp: 16    Complications: No apparent anesthesia complications and patient does feel somewhat dizzy

## 2015-01-13 NOTE — H&P (Signed)
  All labs reviewed. Abnormal studies sent to patients PCP when indicated.  Previous H&P reviewed, patient examined, there are NO CHANGES.  [name] [name] LOUIS8/23/20169:16 AM

## 2015-01-13 NOTE — Op Note (Signed)
PREOPERATIVE DIAGNOSIS:  Nuclear sclerotic cataract of the right eye.   POSTOPERATIVE DIAGNOSIS: CATARACT   OPERATIVE PROCEDURE:  Procedure(s): CATARACT EXTRACTION PHACO AND INTRAOCULAR LENS PLACEMENT (IOC)   SURGEON:  Birder Robson, MD.   ANESTHESIA:  Anesthesiologist: Alvin Critchley, MD CRNA: Nelda Marseille, CRNA  1.      Managed anesthesia care. 2.      Topical tetracaine drops followed by 2% Xylocaine jelly applied in the preoperative holding area.   COMPLICATIONS:  None.   TECHNIQUE:   Stop and chop   DESCRIPTION OF PROCEDURE:  The patient was examined and consented in the preoperative holding area where the aforementioned topical anesthesia was applied to the right eye and then brought back to the Operating Room where the right eye was prepped and draped in the usual sterile ophthalmic fashion and a lid speculum was placed. A paracentesis was created with the side port blade and the anterior chamber was filled with viscoelastic. A near clear corneal incision was performed with the steel keratome. A continuous curvilinear capsulorrhexis was performed with a cystotome followed by the capsulorrhexis forceps. Hydrodissection and hydrodelineation were carried out with BSS on a blunt cannula. The lens was removed in a stop and chop  technique and the remaining cortical material was removed with the irrigation-aspiration handpiece. The capsular bag was inflated with viscoelastic and the Technis ZCB00  lens was placed in the capsular bag without complication. The remaining viscoelastic was removed from the eye with the irrigation-aspiration handpiece. The wounds were hydrated. The anterior chamber was flushed with Miostat and the eye was inflated to physiologic pressure. 0.1 mL of cefuroxime concentration 10 mg/mL was placed in the anterior chamber. The wounds were found to be water tight. The eye was dressed with Vigamox. The patient was given protective glasses to wear throughout the day and a  shield with which to sleep tonight. The patient was also given drops with which to begin a drop regimen today and will follow-up with me in one day.  Implant Name Type Inv. Item Serial No. Manufacturer Lot No. LRB No. Used  LENS IMPL INTRAOC ZCB00 20.0 - [id number] Intraocular Lens LENS IMPL INTRAOC ZCB00 20.0 [id number]   Right 1   Procedure(s) with comments: CATARACT EXTRACTION PHACO AND INTRAOCULAR LENS PLACEMENT (IOC) (Right) - US:01:15.6 AP:19.6 CDE:14.84 LOT PACK #[id number] H  Electronically signed: Canda Podgorski LOUIS 01/13/2015 9:43 AM

## 2015-01-13 NOTE — Anesthesia Preprocedure Evaluation (Addendum)
Anesthesia Evaluation  Patient identified by MRN, date of birth, ID band Patient awake    Reviewed: Allergy & Precautions, NPO status , Patient's Chart, lab work & pertinent test results  Airway Mallampati: II  TM Distance: >3 FB     Dental  (+) Upper Dentures, Lower Dentures   Pulmonary shortness of breath and with exertion, former smoker,  breath sounds clear to auscultation  Pulmonary exam normal       Cardiovascular hypertension, + CAD Normal cardiovascular exam+ dysrhythmias  Heart murmur   Neuro/Psych PSYCHIATRIC DISORDERS Depression    GI/Hepatic Neg liver ROS, IBS   Endo/Other  diabetes, Well Controlled  Renal/GU negative Renal ROS  negative genitourinary   Musculoskeletal  (+) Arthritis -, Osteoarthritis,    Abdominal Normal abdominal exam  (+)   Peds negative pediatric ROS (+)  Hematology negative hematology ROS (+)   Anesthesia Other Findings   Reproductive/Obstetrics                            Anesthesia Physical Anesthesia Plan  ASA: III  Anesthesia Plan: MAC   Post-op Pain Management:    Induction: Intravenous  Airway Management Planned: Nasal Cannula  Additional Equipment:   Intra-op Plan:   Post-operative Plan:   Informed Consent: I have reviewed the patients History and Physical, chart, labs and discussed the procedure including the risks, benefits and alternatives for the proposed anesthesia with the patient or authorized representative who has indicated his/her understanding and acceptance.   Dental advisory given  Plan Discussed with: CRNA and Surgeon  Anesthesia Plan Comments:         Anesthesia Quick Evaluation

## 2015-01-13 NOTE — Anesthesia Postprocedure Evaluation (Signed)
  Anesthesia Post-op Note  Patient: [name] [name]  Procedure(s) Performed: Procedure(s) with comments: CATARACT EXTRACTION PHACO AND INTRAOCULAR LENS PLACEMENT (IOC) (Right) - US:01:15.6 AP:19.6 CDE:14.84 LOT ASTM #[id number] H  Anesthesia type:MAC  Patient location: PACU  Post pain: Pain level controlled  Post assessment: Post-op Vital signs reviewed, Patient's Cardiovascular Status Stable, Respiratory Function Stable, Patent Airway and No signs of Nausea or vomiting  Post vital signs: Reviewed and stable  Last Vitals:  Filed Vitals:   01/13/15 0836  BP: 142/88  Pulse: 78  Temp: 36.2 C  Resp: 16    Level of consciousness: awake, alert  and patient cooperative  Complications: No apparent anesthesia complications but pt feels somewhat drowsy

## 2015-01-27 ENCOUNTER — Other Ambulatory Visit: Payer: Self-pay | Admitting: Family Medicine

## 2015-01-27 ENCOUNTER — Ambulatory Visit
Admission: RE | Admit: 2015-01-27 | Discharge: 2015-01-27 | Disposition: A | Discharge status: Home / Self Care | Payer: Commercial Managed Care - HMO | Source: Ambulatory Visit | Attending: Internal Medicine | Admitting: Internal Medicine

## 2015-01-27 ENCOUNTER — Other Ambulatory Visit: Payer: Self-pay | Admitting: Internal Medicine

## 2015-01-27 DIAGNOSIS — Z1231 Encounter for screening mammogram for malignant neoplasm of breast: Secondary | ICD-10-CM | POA: Insufficient documentation

## 2015-02-03 IMAGING — CT CT CHEST W/O CM
2 of 3 series · 15 of 36 positions shown, 18 images · non-contrast
Comparison: 06/22/2013, 05/20/2013, 11/14/2012 and 07/03/2012.

CLINICAL DATA: Followup lung nodules.

EXAM:
CT CHEST WITHOUT CONTRAST
TECHNIQUE: Multidetector CT imaging of the chest was performed following the
standard protocol without IV contrast..

[Series 2: routine chest wo · axial · 0.59mm/px · z∈[-780,-530]mm · 12 of 60 slices shown, 15 images]
[im 5/60  mediastinal]
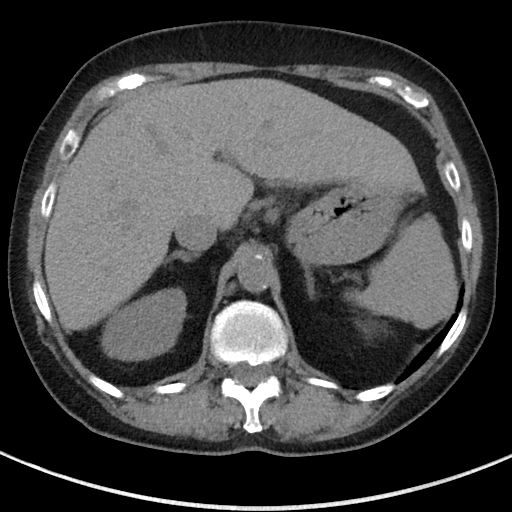
[im 5/60  lung]
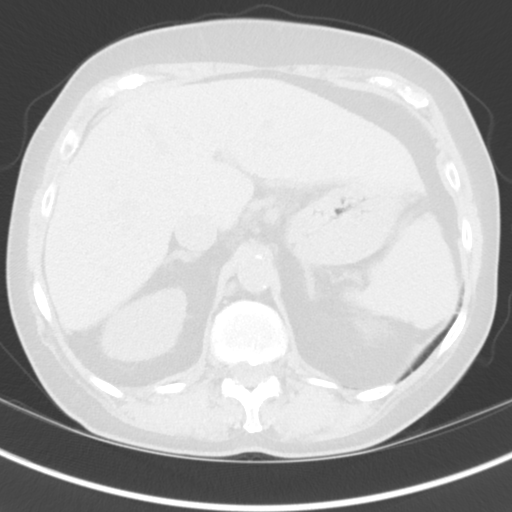
[im 9/60  lung]
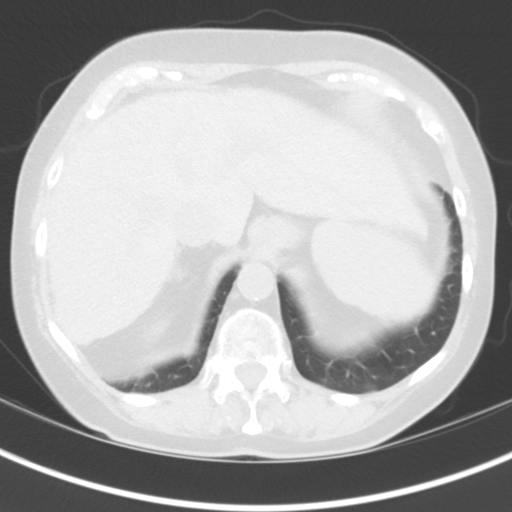
[im 14/60  lung]
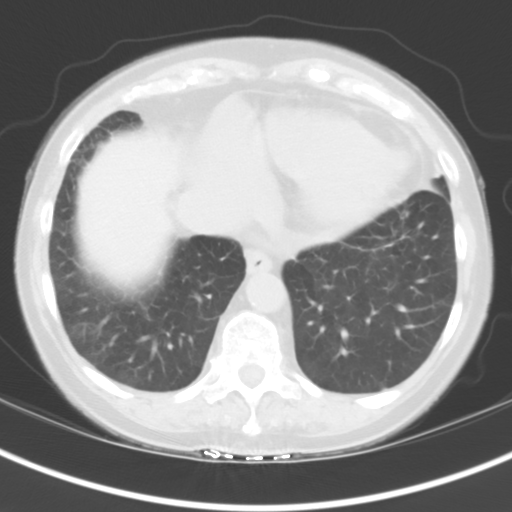
[im 18/60  lung]
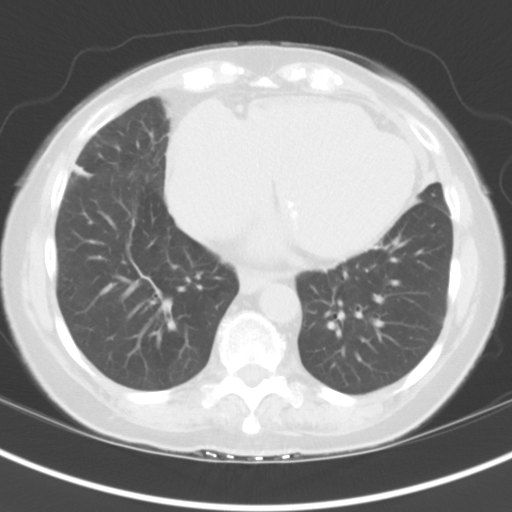
[im 22/60  mediastinal]
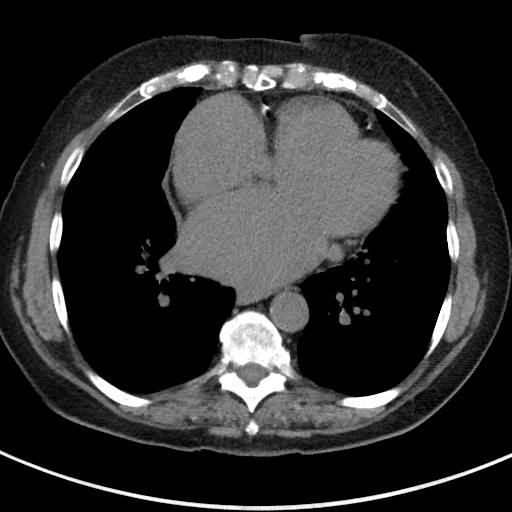
[im 22/60  lung]
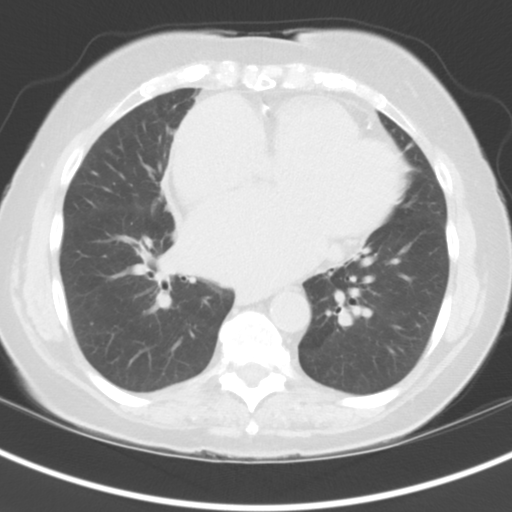
[im 27/60  lung]
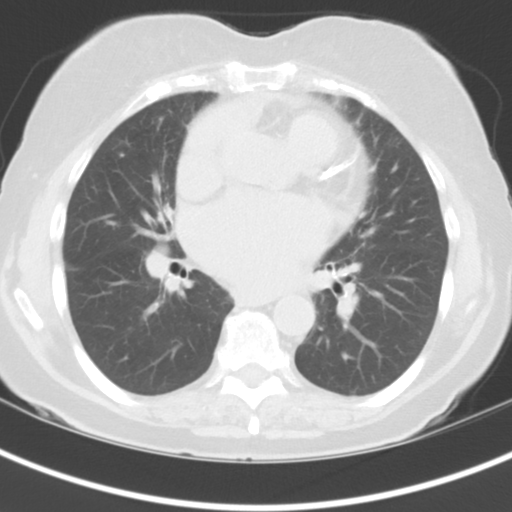
[im 33/60  lung]
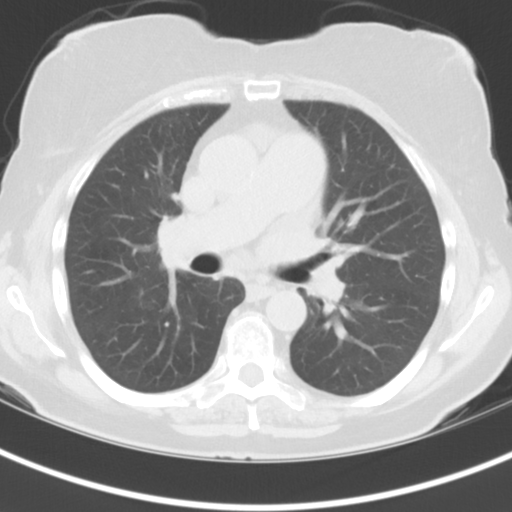
[im 38/60  lung]
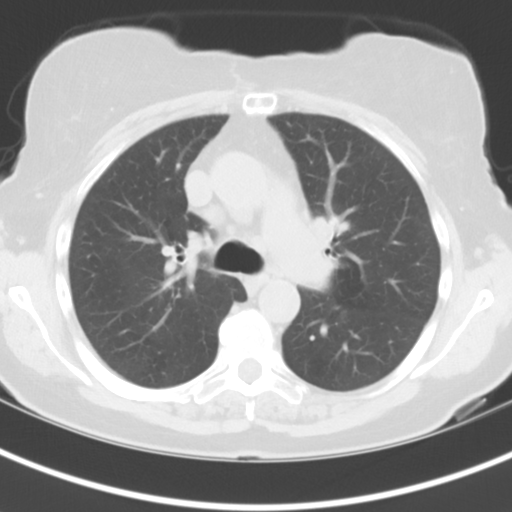
[im 42/60  mediastinal]
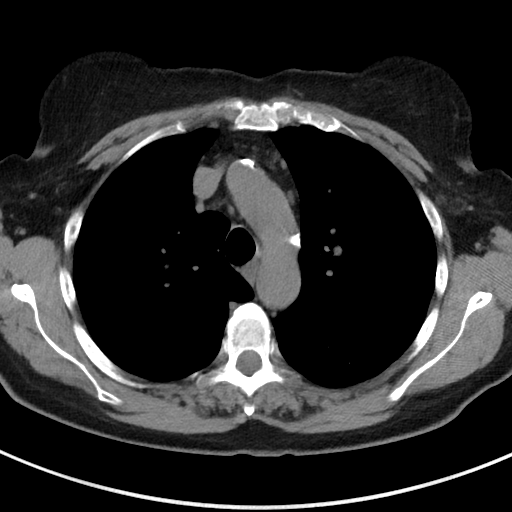
[im 42/60  lung]
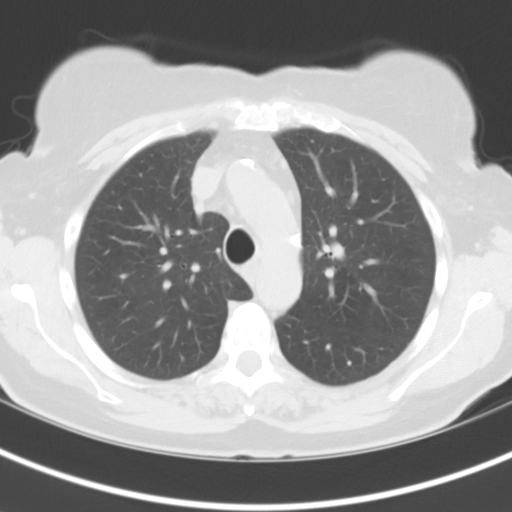
[im 46/60  lung]
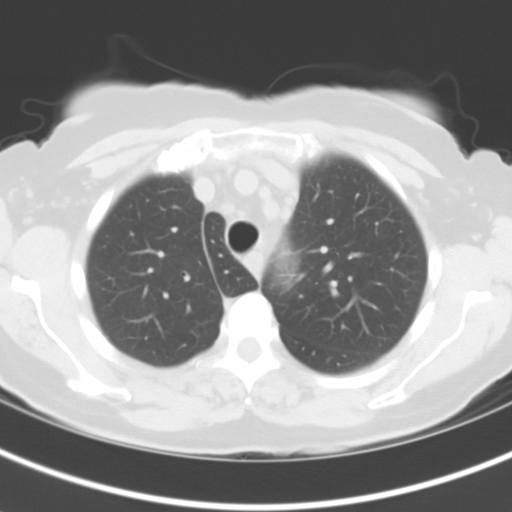
[im 51/60  lung]
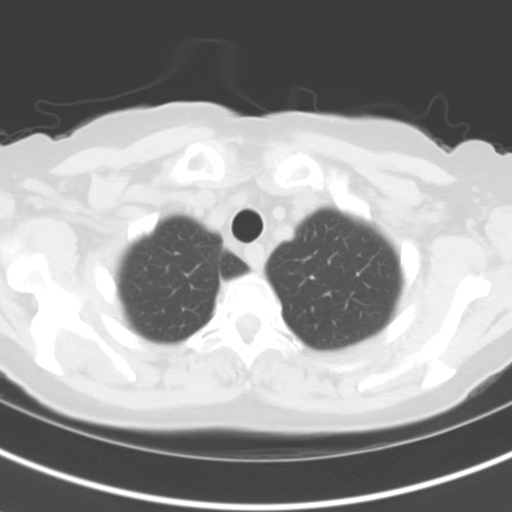
[im 55/60  lung]
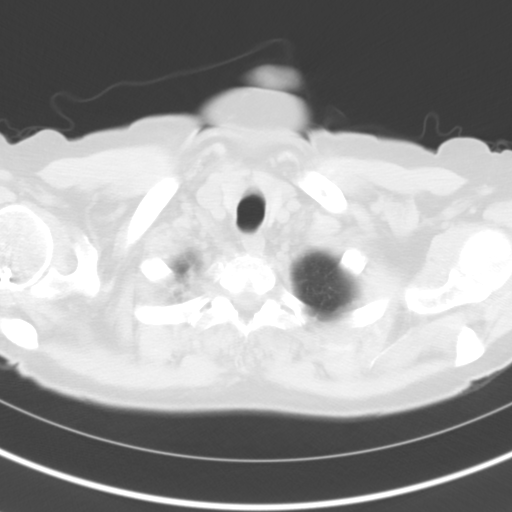

[Series 5: cor routine chest wo · coronal · 0.59mm/px · 3 of 124 slices shown]
[im 25/124  lung]
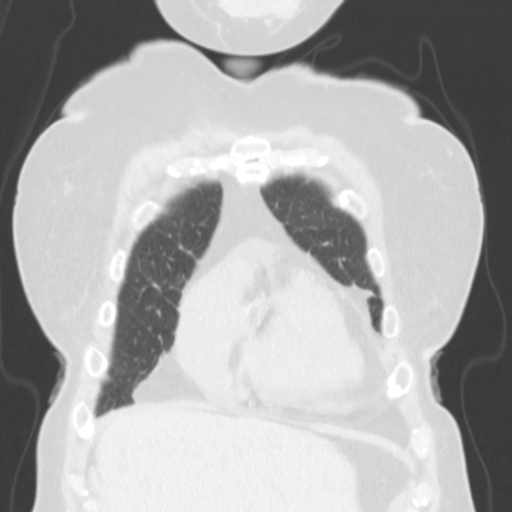
[im 50/124  lung]
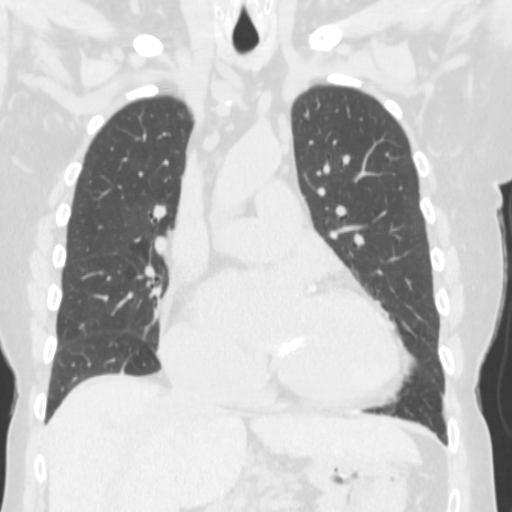
[im 74/124  lung]
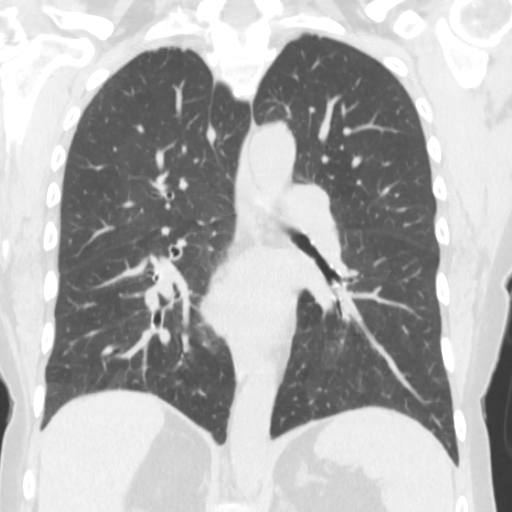

[15 of 36 positions shown; findings below may reference images not displayed]

FINDINGS: Mediastinal lymph nodes measure up to 11 mm in the lower right
paratracheal station, stable from 11/14/2012. Hilar regions are
difficult to definitively evaluate without IV contrast. No axillary
adenopathy. Pulmonary arteries and heart are enlarged.
Atherosclerotic calcification of the arterial vasculature, including
three-vessel involvement of the coronary arteries. No pericardial
effusion. Tiny hiatal hernia.

Mild scarring is seen in the posterior segment right upper lobe,
adjacent to the major fissure. Tiny nodule in the right middle lobe,
along the minor fissure (series 4, image 34) is unchanged and may
represent a granuloma. Scattered pulmonary parenchymal scarring,
basilar dependent. 4 mm subpleural nodular density in the left lower
lobe is unchanged from 11/14/2012. No pleural fluid. Airway is
unremarkable.

Incidental imaging of the upper abdomen shows the visualized
portions of the liver, adrenal glands, kidneys, spleen and stomach
to be grossly unremarkable. Upper abdominal lymph nodes are
subcentimeter in short axis size. No worrisome lytic or sclerotic
lesions. Degenerative changes are seen in the spine.
IMPRESSION: 1. Scattered pulmonary nodules measure 4 mm or less in size and are
unchanged from 11/14/2012, rendering them benign.
2. Enlarged pulmonary arteries, indicative of pulmonary arterial
hypertension.
3. Three-vessel coronary artery calcification.

## 2015-02-21 ENCOUNTER — Emergency Department: Payer: Commercial Managed Care - HMO

## 2015-02-21 ENCOUNTER — Observation Stay: Payer: Commercial Managed Care - HMO

## 2015-02-21 ENCOUNTER — Observation Stay
Admission: EM | Admit: 2015-02-21 | Discharge: 2015-02-25 | Disposition: A | Discharge status: Home / Self Care | Payer: Commercial Managed Care - HMO | Attending: Internal Medicine | Admitting: Internal Medicine

## 2015-02-21 ENCOUNTER — Encounter: Payer: Self-pay | Admitting: Emergency Medicine

## 2015-02-21 ENCOUNTER — Other Ambulatory Visit: Payer: Self-pay

## 2015-02-21 DIAGNOSIS — E785 Hyperlipidemia, unspecified: Secondary | ICD-10-CM | POA: Insufficient documentation

## 2015-02-21 DIAGNOSIS — K580 Irritable bowel syndrome with diarrhea: Secondary | ICD-10-CM | POA: Insufficient documentation

## 2015-02-21 DIAGNOSIS — K529 Noninfective gastroenteritis and colitis, unspecified: Secondary | ICD-10-CM | POA: Diagnosis not present

## 2015-02-21 DIAGNOSIS — K8020 Calculus of gallbladder without cholecystitis without obstruction: Secondary | ICD-10-CM | POA: Insufficient documentation

## 2015-02-21 DIAGNOSIS — F329 Major depressive disorder, single episode, unspecified: Secondary | ICD-10-CM | POA: Diagnosis not present

## 2015-02-21 DIAGNOSIS — R0602 Shortness of breath: Secondary | ICD-10-CM | POA: Diagnosis not present

## 2015-02-21 DIAGNOSIS — Z87891 Personal history of nicotine dependence: Secondary | ICD-10-CM | POA: Insufficient documentation

## 2015-02-21 DIAGNOSIS — Z79899 Other long term (current) drug therapy: Secondary | ICD-10-CM | POA: Insufficient documentation

## 2015-02-21 DIAGNOSIS — R160 Hepatomegaly, not elsewhere classified: Secondary | ICD-10-CM | POA: Insufficient documentation

## 2015-02-21 DIAGNOSIS — K640 First degree hemorrhoids: Secondary | ICD-10-CM | POA: Insufficient documentation

## 2015-02-21 DIAGNOSIS — E876 Hypokalemia: Secondary | ICD-10-CM | POA: Insufficient documentation

## 2015-02-21 DIAGNOSIS — Z9862 Peripheral vascular angioplasty status: Secondary | ICD-10-CM | POA: Insufficient documentation

## 2015-02-21 DIAGNOSIS — R109 Unspecified abdominal pain: Secondary | ICD-10-CM | POA: Diagnosis present

## 2015-02-21 DIAGNOSIS — Z881 Allergy status to other antibiotic agents status: Secondary | ICD-10-CM | POA: Diagnosis not present

## 2015-02-21 DIAGNOSIS — N280 Ischemia and infarction of kidney: Secondary | ICD-10-CM | POA: Insufficient documentation

## 2015-02-21 DIAGNOSIS — R011 Cardiac murmur, unspecified: Secondary | ICD-10-CM | POA: Diagnosis not present

## 2015-02-21 DIAGNOSIS — R197 Diarrhea, unspecified: Secondary | ICD-10-CM | POA: Diagnosis present

## 2015-02-21 DIAGNOSIS — Z885 Allergy status to narcotic agent status: Secondary | ICD-10-CM | POA: Insufficient documentation

## 2015-02-21 DIAGNOSIS — Z7901 Long term (current) use of anticoagulants: Secondary | ICD-10-CM | POA: Diagnosis not present

## 2015-02-21 DIAGNOSIS — Z888 Allergy status to other drugs, medicaments and biological substances status: Secondary | ICD-10-CM | POA: Diagnosis not present

## 2015-02-21 DIAGNOSIS — I2510 Atherosclerotic heart disease of native coronary artery without angina pectoris: Secondary | ICD-10-CM | POA: Insufficient documentation

## 2015-02-21 DIAGNOSIS — Z96651 Presence of right artificial knee joint: Secondary | ICD-10-CM | POA: Diagnosis not present

## 2015-02-21 DIAGNOSIS — D120 Benign neoplasm of cecum: Secondary | ICD-10-CM | POA: Insufficient documentation

## 2015-02-21 DIAGNOSIS — Z9841 Cataract extraction status, right eye: Secondary | ICD-10-CM | POA: Insufficient documentation

## 2015-02-21 DIAGNOSIS — I4891 Unspecified atrial fibrillation: Secondary | ICD-10-CM | POA: Diagnosis not present

## 2015-02-21 DIAGNOSIS — E119 Type 2 diabetes mellitus without complications: Secondary | ICD-10-CM | POA: Insufficient documentation

## 2015-02-21 DIAGNOSIS — M1990 Unspecified osteoarthritis, unspecified site: Secondary | ICD-10-CM | POA: Diagnosis not present

## 2015-02-21 DIAGNOSIS — I10 Essential (primary) hypertension: Secondary | ICD-10-CM | POA: Diagnosis not present

## 2015-02-21 DIAGNOSIS — K629 Disease of anus and rectum, unspecified: Secondary | ICD-10-CM | POA: Insufficient documentation

## 2015-02-21 HISTORY — DX: Unspecified atrial fibrillation: I48.91

## 2015-02-21 LAB — CBC WITH DIFFERENTIAL/PLATELET
BASOS PCT: 0 %
Basophils Absolute: 0 10*3/uL (ref 0–0.1)
EOS ABS: 0 10*3/uL (ref 0–0.7)
Eosinophils Relative: 0 %
HEMATOCRIT: 49.8 % — AB (ref 35.0–47.0)
Hemoglobin: 16.6 g/dL — ABNORMAL HIGH (ref 12.0–16.0)
LYMPHS ABS: 0.4 10*3/uL — AB (ref 1.0–3.6)
Lymphocytes Relative: 3 %
MCH: 29.9 pg (ref 26.0–34.0)
MCHC: 33.3 g/dL (ref 32.0–36.0)
MCV: 89.9 fL (ref 80.0–100.0)
MONO ABS: 0.6 10*3/uL (ref 0.2–0.9)
MONOS PCT: 5 %
NEUTROS ABS: 11.1 10*3/uL — AB (ref 1.4–6.5)
Neutrophils Relative %: 92 %
Platelets: 196 10*3/uL (ref 150–440)
RBC: 5.54 MIL/uL — ABNORMAL HIGH (ref 3.80–5.20)
RDW: 16.2 % — AB (ref 11.5–14.5)
WBC: 12.2 10*3/uL — ABNORMAL HIGH (ref 3.6–11.0)

## 2015-02-21 LAB — C DIFFICILE QUICK SCREEN W PCR REFLEX
C DIFFICILE (CDIFF) INTERP: NEGATIVE
C DIFFICILE (CDIFF) TOXIN: NEGATIVE
C Diff antigen: NEGATIVE

## 2015-02-21 LAB — URINALYSIS COMPLETE WITH MICROSCOPIC (ARMC ONLY)
BILIRUBIN URINE: NEGATIVE
Bacteria, UA: NONE SEEN
GLUCOSE, UA: NEGATIVE mg/dL
Hgb urine dipstick: NEGATIVE
KETONES UR: NEGATIVE mg/dL
LEUKOCYTES UA: NEGATIVE
NITRITE: NEGATIVE
Protein, ur: 100 mg/dL — AB
SPECIFIC GRAVITY, URINE: 1.023 (ref 1.005–1.030)
pH: 6 (ref 5.0–8.0)

## 2015-02-21 LAB — COMPREHENSIVE METABOLIC PANEL
ALBUMIN: 5 g/dL (ref 3.5–5.0)
ALT: 19 U/L (ref 14–54)
ANION GAP: 9 (ref 5–15)
AST: 30 U/L (ref 15–41)
Alkaline Phosphatase: 82 U/L (ref 38–126)
BUN: 24 mg/dL — ABNORMAL HIGH (ref 6–20)
CALCIUM: 9.8 mg/dL (ref 8.9–10.3)
CO2: 26 mmol/L (ref 22–32)
Chloride: 103 mmol/L (ref 101–111)
Creatinine, Ser: 1 mg/dL (ref 0.44–1.00)
GFR calc non Af Amer: 55 mL/min — ABNORMAL LOW (ref >60)
GLUCOSE: 129 mg/dL — AB (ref 65–99)
POTASSIUM: 4.1 mmol/L (ref 3.5–5.1)
Sodium: 138 mmol/L (ref 135–145)
TOTAL PROTEIN: 8.8 g/dL — AB (ref 6.5–8.1)
Total Bilirubin: 2.3 mg/dL — ABNORMAL HIGH (ref 0.3–1.2)

## 2015-02-21 LAB — LACTIC ACID, PLASMA: Lactic Acid, Venous: 1.7 mmol/L (ref 0.5–2.0)

## 2015-02-21 LAB — LIPASE, BLOOD: LIPASE: 30 U/L (ref 22–51)

## 2015-02-21 LAB — DIGOXIN LEVEL: Digoxin Level: 0.5 ng/mL — ABNORMAL LOW (ref 0.8–2.0)

## 2015-02-21 LAB — MAGNESIUM: Magnesium: 1.8 mg/dL (ref 1.7–2.4)

## 2015-02-21 MED ORDER — ENOXAPARIN SODIUM 40 MG/0.4ML ~~LOC~~ SOLN: 40.0000 mg | SUBCUTANEOUS | Status: DC

## 2015-02-21 MED ORDER — RIVAROXABAN 20 MG PO TABS
20.0000 mg | ORAL_TABLET | Freq: Every day | ORAL | Status: DC
  Administered 2015-02-21: 20 mg via ORAL
  Filled 2015-02-21: qty 1

## 2015-02-21 MED ORDER — IOHEXOL 240 MG/ML SOLN
25.0000 mL | Freq: Once | INTRAMUSCULAR | Status: AC | PRN
  Administered 2015-02-21: 25 mL via ORAL

## 2015-02-21 MED ORDER — DICYCLOMINE HCL 20 MG PO TABS
20.0000 mg | ORAL_TABLET | Freq: Three times a day (TID) | ORAL | Status: DC
  Administered 2015-02-21 – 2015-02-22 (×3): 20 mg via ORAL
  Filled 2015-02-21 (×3): qty 1

## 2015-02-21 MED ORDER — DIGOXIN 125 MCG PO TABS
0.1250 mg | ORAL_TABLET | Freq: Every day | ORAL | Status: DC
  Administered 2015-02-22 – 2015-02-25 (×4): 0.125 mg via ORAL
  Filled 2015-02-21 (×4): qty 1

## 2015-02-21 MED ORDER — DONEPEZIL HCL 5 MG PO TABS
5.0000 mg | ORAL_TABLET | Freq: Every day | ORAL | Status: DC
  Administered 2015-02-21 – 2015-02-24 (×4): 5 mg via ORAL
  Filled 2015-02-21 (×4): qty 1

## 2015-02-21 MED ORDER — ONDANSETRON HCL 4 MG/2ML IJ SOLN
4.0000 mg | Freq: Four times a day (QID) | INTRAMUSCULAR | Status: DC | PRN
  Administered 2015-02-21 – 2015-02-22 (×4): 4 mg via INTRAVENOUS
  Filled 2015-02-21 (×4): qty 2

## 2015-02-21 MED ORDER — ESCITALOPRAM OXALATE 10 MG PO TABS
20.0000 mg | ORAL_TABLET | Freq: Every day | ORAL | Status: DC
  Administered 2015-02-21 – 2015-02-25 (×5): 20 mg via ORAL
  Filled 2015-02-21 (×5): qty 2

## 2015-02-21 MED ORDER — SODIUM CHLORIDE 0.9 % IV BOLUS (SEPSIS)
1000.0000 mL | Freq: Once | INTRAVENOUS | Status: AC
  Administered 2015-02-21: 16:00:00 1000 mL via INTRAVENOUS

## 2015-02-21 MED ORDER — DILTIAZEM HCL ER COATED BEADS 240 MG PO CP24
240.0000 mg | ORAL_CAPSULE | Freq: Every day | ORAL | Status: DC
  Administered 2015-02-22: 09:00:00 240 mg via ORAL
  Filled 2015-02-21: qty 1

## 2015-02-21 MED ORDER — ZOLPIDEM TARTRATE 5 MG PO TABS
5.0000 mg | ORAL_TABLET | Freq: Every evening | ORAL | Status: DC | PRN
  Administered 2015-02-24: 22:00:00 5 mg via ORAL
  Filled 2015-02-21 (×2): qty 1

## 2015-02-21 MED ORDER — GADOBENATE DIMEGLUMINE 529 MG/ML IV SOLN
15.0000 mL | Freq: Once | INTRAVENOUS | Status: AC | PRN
  Administered 2015-02-21: 14 mL via INTRAVENOUS

## 2015-02-21 MED ORDER — DIGOXIN 125 MCG PO TABS
0.1250 mg | ORAL_TABLET | Freq: Every day | ORAL | Status: DC
  Administered 2015-02-21: 0.125 mg via ORAL
  Filled 2015-02-21: qty 1

## 2015-02-21 MED ORDER — DIAZEPAM 5 MG PO TABS
5.0000 mg | ORAL_TABLET | Freq: Four times a day (QID) | ORAL | Status: DC | PRN
Start: 2015-02-21 — End: 2015-02-25
  Administered 2015-02-21 – 2015-02-23 (×3): 5 mg via ORAL
  Filled 2015-02-21 (×3): qty 1

## 2015-02-21 MED ORDER — PANTOPRAZOLE SODIUM 40 MG IV SOLR
40.0000 mg | Freq: Once | INTRAVENOUS | Status: AC
  Administered 2015-02-21: 40 mg via INTRAVENOUS
  Filled 2015-02-21: qty 40

## 2015-02-21 MED ORDER — IOHEXOL 300 MG/ML  SOLN
100.0000 mL | Freq: Once | INTRAMUSCULAR | Status: AC | PRN
  Administered 2015-02-21: 100 mL via INTRAVENOUS

## 2015-02-21 MED ORDER — PRAVASTATIN SODIUM 10 MG PO TABS
10.0000 mg | ORAL_TABLET | Freq: Every day | ORAL | Status: DC
  Administered 2015-02-21 – 2015-02-24 (×4): 10 mg via ORAL
  Filled 2015-02-21 (×4): qty 1

## 2015-02-21 MED ORDER — INFLUENZA VAC SPLIT QUAD 0.5 ML IM SUSY
0.5000 mL | PREFILLED_SYRINGE | INTRAMUSCULAR | Status: AC
  Administered 2015-02-22: 09:00:00 0.5 mL via INTRAMUSCULAR
  Filled 2015-02-21: qty 0.5

## 2015-02-21 MED ORDER — SODIUM CHLORIDE 0.9 % IV BOLUS (SEPSIS)
1000.0000 mL | Freq: Once | INTRAVENOUS | Status: AC
  Administered 2015-02-21: 1000 mL via INTRAVENOUS

## 2015-02-21 MED ORDER — ACETAMINOPHEN 650 MG RE SUPP: 650.0000 mg | Freq: Four times a day (QID) | RECTAL | Status: DC | PRN

## 2015-02-21 MED ORDER — ZOLPIDEM TARTRATE 5 MG PO TABS: 10.0000 mg | ORAL_TABLET | Freq: Every evening | ORAL | Status: DC | PRN

## 2015-02-21 MED ORDER — ACETAMINOPHEN 325 MG PO TABS
650.0000 mg | ORAL_TABLET | Freq: Four times a day (QID) | ORAL | Status: DC | PRN
  Administered 2015-02-22: 05:00:00 650 mg via ORAL
  Filled 2015-02-21: qty 2

## 2015-02-21 MED ORDER — DILTIAZEM HCL ER COATED BEADS 240 MG PO CP24
240.0000 mg | ORAL_CAPSULE | Freq: Once | ORAL | Status: AC
  Administered 2015-02-21: 240 mg via ORAL
  Filled 2015-02-21: qty 1

## 2015-02-21 MED ORDER — POTASSIUM CHLORIDE IN NACL 20-0.9 MEQ/L-% IV SOLN
INTRAVENOUS | Status: DC
  Administered 2015-02-21 – 2015-02-24 (×8): via INTRAVENOUS
  Filled 2015-02-21 (×11): qty 1000

## 2015-02-21 NOTE — Plan of Care (Signed)
Problem: Discharge Progression Outcomes Goal: Discharge plan in place and appropriate Outcome: Progressing Individualization: 1. 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 2. Reports some mild forgetfulness; takes aricept at night. MD aware and was going to do more evaluation at [facility] 1 month appt. 3. Pt reports history of anxiety which diazepam effectve. 4. Reports onset diarrhea 4 months ago, but has 20+ stools since 12 MN last night with IBS. Pt just took 1 dose of new med after seeing MD yesterday. C.Diff negative. 5.HIGH risk for fall- due to urgency, frequency, incontinence of stools, generalized weakness.-fall precautions, protective footwear, bed alarm activated, keep personal items in hand reach, use BSC prn. Offer toileting hourly;respond to calls as promptly as possible (room near NS). PMH: a fib, HRN, hyperlipidemia, CAD, IBS, Heart murmur, DM- controlled by home meds and diet except diarrhea. Goal: Other Discharge Outcomes/Goals Outcome: Progressing 1. Plans to return home to self care. 2. Denies pain. 3. Reports nausea with zofran given with benefit, generalized weakness, VSS. Having watery stools still. IVF's continued. 4. Having diarrhea-stool samples sent to lab. Additional labs in process. MRI being done currently. Pt having anxiety which she reports is chronic with diazepam effective. 5. Tolerated full liquid diet well; no emesis. Does report decreased appetite. 6. Up to BR- generalized weakness; mildly unsteady. Has urgency/frequency. 7. Meds given as ordered. Emotional support given.

## 2015-02-21 NOTE — ED Provider Notes (Signed)
[facility] Emergency Department Provider Note  ____________________________________________   I have reviewed the triage vital signs and the nursing notes.   HISTORY  Chief Complaint Abdominal Pain and Diarrhea    HPI [name] [name] is a 74 y.o. female presents to the complaining of diarrhea. She has chronic diarrhea for the last several months but has been worse over the last few days. She went to see Dr. Doy Hutching, he started her on clidinium, she took 1 pill last night and then had copious diarrhea all night long. She has had 14 watery diarrhea since midnight. She has some cramping diffusely more on the right than on the left side no fevers no vomiting.She has had no weight loss no melena bright red blood per rectum no hematemesis.  Past Medical History  Diagnosis Date  . Shortness of breath dyspnea   . Dysrhythmia     afib  . Coronary artery disease   . Hypertension   . Heart murmur   . Arthritis   . Depression   . Diabetes mellitus without complication ([place])   . IBS (irritable bowel syndrome)     There are no active problems to display for this patient.   Past Surgical History  Procedure Laterality Date  . Coronary angioplasty    . Eye surgery    . Joint replacement Right     tkr  . Shoulder arthroscopy with rotator cuff repair    . Cataract extraction w/phaco Right 01/13/2015    Procedure: CATARACT EXTRACTION PHACO AND INTRAOCULAR LENS PLACEMENT (IOC);  Surgeon: Birder Robson, MD;  Location: [facility] ORS;  Service: Ophthalmology;  Laterality: Right;  US:01:15.6 AP:19.6 CDE:14.84 LOT PACK #[id number] H    Current Outpatient Rx  Name  Route  Sig  Dispense  Refill  . diazepam (VALIUM) 5 MG tablet   Oral   Take 5 mg by mouth every 6 (six) hours as needed for anxiety.         . digoxin (LANOXIN) 0.125 MG tablet   Oral   Take 0.125 mg by mouth daily.         Marland Kitchen diltiazem (DILACOR XR) 240 MG 24 hr capsule   Oral   Take 240 mg by mouth  daily.         Marland Kitchen donepezil (ARICEPT) 5 MG tablet   Oral   Take 5 mg by mouth at bedtime.         Marland Kitchen escitalopram (LEXAPRO) 20 MG tablet   Oral   Take 20 mg by mouth daily.         . furosemide (LASIX) 20 MG tablet   Oral   Take 20 mg by mouth.         . pravastatin (PRAVACHOL) 10 MG tablet   Oral   Take 10 mg by mouth daily.         . rivaroxaban (XARELTO) 20 MG TABS tablet   Oral   Take 20 mg by mouth daily with supper.         . zolpidem (AMBIEN) 10 MG tablet   Oral   Take 10 mg by mouth at bedtime as needed for sleep.           Allergies Levaquin; Phenergan; Septra; and Tetracyclines & related  History reviewed. No pertinent family history.  Social History Social History  Substance Use Topics  . Smoking status: Former Research scientist (life sciences)  . Smokeless tobacco: Never Used  . Alcohol Use: No    Review of Systems Constitutional:  No fever/chills Eyes: No visual changes. ENT: No sore throat. No stiff neck no neck pain Cardiovascular: Denies chest pain. Respiratory: Denies shortness of breath. Gastrointestinal:   no vomiting.  Positive diarrhea.  No constipation. Genitourinary: Negative for dysuria. Musculoskeletal: Negative lower extremity swelling Skin: Negative for rash. Neurological: Negative for headaches, focal weakness or numbness. 10-point ROS otherwise negative.  ____________________________________________   PHYSICAL EXAM:  VITAL SIGNS: ED Triage Vitals  Enc Vitals Group     BP 02/21/15 0857 127/77 mmHg     Pulse Rate 02/21/15 0857 68     Resp 02/21/15 0857 20     Temp 02/21/15 0857 97.4 F (36.3 C)     Temp Source 02/21/15 0857 Oral     SpO2 02/21/15 0857 94 %     Weight 02/21/15 0857 158 lb (71.668 kg)     Height 02/21/15 0857 5\' 6"  (1.676 m)     Head Cir --      Peak Flow --      Pain Score 02/21/15 0857 8     Pain Loc --      Pain Edu? --      Excl. in [place]? --     Constitutional: Alert and oriented. Well appearing and in no acute  distress. Eyes: Conjunctivae are normal. PERRL. EOMI. Head: Atraumatic. Nose: No congestion/rhinnorhea. Mouth/Throat: Mucous membranes are dry.  Oropharynx non-erythematous. Neck: No stridor.   Nontender with no meningismus Cardiovascular: Normal rate, regular rhythm. Grossly normal heart sounds.  Good peripheral circulation. Respiratory: Normal respiratory effort.  No retractions. Lungs CTAB. Gastrointestinal: Soft and minimally tender on the right side, no guarding no rebound. Pain is present in the right upper abdomen, no guarding no rebound, no right lower quadrant pain No distention. No guarding no rebound Back:  There is no focal tenderness or step off there is no midline tenderness there are no lesions noted. there is no CVA tenderness Musculoskeletal: No lower extremity tenderness. No joint effusions, no DVT signs strong distal pulses no edema Neurologic:  Normal speech and language. No gross focal neurologic deficits are appreciated.  Skin:  Skin is warm, dry and intact. No rash noted. Psychiatric: Mood and affect are normal. Speech and behavior are normal.  ____________________________________________   LABS (all labs ordered are listed, but only abnormal results are displayed)  Labs Reviewed  CBC WITH DIFFERENTIAL/PLATELET - Abnormal; Notable for the following:    WBC 12.2 (*)    RBC 5.54 (*)    Hemoglobin 16.6 (*)    HCT 49.8 (*)    RDW 16.2 (*)    Neutro Abs 11.1 (*)    Lymphs Abs 0.4 (*)    All other components within normal limits  COMPREHENSIVE METABOLIC PANEL - Abnormal; Notable for the following:    Glucose, Bld 129 (*)    BUN 24 (*)    Total Protein 8.8 (*)    Total Bilirubin 2.3 (*)    GFR calc non Af Amer 55 (*)    All other components within normal limits  URINALYSIS COMPLETEWITH MICROSCOPIC ([facility] ONLY) - Abnormal; Notable for the following:    Color, Urine YELLOW (*)    APPearance CLEAR (*)    Protein, ur 100 (*)    Squamous Epithelial / LPF 0-5 (*)     All other components within normal limits  DIGOXIN LEVEL - Abnormal; Notable for the following:    Digoxin Level 0.5 (*)    All other components within normal limits  C DIFFICILE QUICK  SCREEN W PCR REFLEX  STOOL CULTURE  LIPASE, BLOOD  LACTIC ACID, PLASMA  MAGNESIUM   ____________________________________________  EKG  Reviewed by me ____________________________________________  RADIOLOGY  Reviewed by me ____________________________________________   PROCEDURES  Procedure(s) performed: None  Critical Care performed: None  ____________________________________________   INITIAL IMPRESSION / ASSESSMENT AND PLAN / ED COURSE  Pertinent labs & imaging results that were available during my care of the patient were reviewed by me and considered in my medical decision making (see chart for details).  Patient with a benign exam although she does have right-sided abdominal discomfort. There is no evidence of appendicitis. Serial abdominal exams show no surgical pathology. Patient has had several episodes of diarrhea since she has been here and she feels depleted. Blood work and vital signs are reassuring. Given that she has had 20 episodes of diarrhea she states now since last night, I do not think that she will do well at home and I have discussed it with her. She much prefer to be admitted because she started to feel weak. Overall she is nontoxic in appearance we will admit her for observation. Blood work reassuring vital signs reassuring labs reassuring imaging reassuring hospitalist agrees with plan ____________________________________________   FINAL CLINICAL IMPRESSION(S) / ED DIAGNOSES  Final diagnoses:  Abdominal pain  Diarrhea, unspecified type     Schuyler Amor, MD 02/21/15 1301

## 2015-02-21 NOTE — H&P (Signed)
[place] [facility] [place] NAME: [name] [name]    MR#:  [id number]  DATE OF BIRTH:  [birth year]  DATE OF ADMISSION:  02/21/2015  PRIMARY CARE PHYSICIAN: Idelle Crouch, MD   REQUESTING/REFERRING PHYSICIAN: Dr Charlotte Crumb  CHIEF COMPLAINT:   Chief Complaint  Patient presents with  . Abdominal Pain  . Diarrhea    HISTORY OF PRESENT ILLNESS:  [name] [name]  is a 74 y.o. female with a known history of atrial fibrillation, hypertension, hyperlipidemia, heart disease, IBS. She presents to the ER with 20 episodes of diarrhea watery in nature, no blood in the bowel movements. She has abdominal pain 8 out of 10 intensity constant and nothing makes it better or worse. It is associated with nausea. Of note, she's had on and off diarrhea for the past 2-3 months and she saw Dr. Doy Hutching yesterday and was started on Librax. She had a colonoscopy 3 years ago which showed 1 polyp. In the emergency room, she had a CT scan of the abdomen which showed a liver mass. An ultrasound which also showed liver mass. Hospitalist services were contacted for further evaluation.  PAST MEDICAL HISTORY:   Past Medical History  Diagnosis Date  . Shortness of breath dyspnea   . Dysrhythmia     afib  . Coronary artery disease   . Hypertension   . Heart murmur   . Arthritis   . Depression   . Diabetes mellitus without complication ([place])   . IBS (irritable bowel syndrome)   . Atrial fibrillation ([place])     PAST SURGICAL HISTORY:   Past Surgical History  Procedure Laterality Date  . Coronary angioplasty    . Eye surgery    . Joint replacement Right     tkr  . Shoulder arthroscopy with rotator cuff repair    . Cataract extraction w/phaco Right 01/13/2015    Procedure: CATARACT EXTRACTION PHACO AND INTRAOCULAR LENS PLACEMENT (IOC);  Surgeon: Birder Robson, MD;  Location: [facility] ORS;  Service: Ophthalmology;  Laterality: Right;   US:01:15.6 AP:19.6 CDE:14.84 LOT PACK #[id number] H    SOCIAL HISTORY:   Social History  Substance Use Topics  . Smoking status: Former Research scientist (life sciences)  . Smokeless tobacco: Never Used  . Alcohol Use: No    FAMILY HISTORY:   Family History  Problem Relation Age of Onset  . Alzheimer's disease Mother   . Hypertension Mother   . Stroke Mother   . Alzheimer's disease Father   . Hypertension Father   . Stroke Father   . Diabetes Father     DRUG ALLERGIES:   Allergies  Allergen Reactions  . Levaquin [Levofloxacin In D5w] Other (See Comments)    Muscle pain.  . Oxycodone Other (See Comments)    Altered mental status  . Phenergan [Promethazine Hcl]   . Septra [Sulfamethoxazole-Trimethoprim]   . Tetracyclines & Related     REVIEW OF SYSTEMS:  CONSTITUTIONAL: No fever, positive for sweats. Positive for weight loss 45 pounds over one month. Positive for fatigue.  EYES: No blurred or double vision. Wears glasses. EARS, NOSE, AND THROAT: No tinnitus or ear pain. No sore throat. Positive for postnasal drip. RESPIRATORY: Some cough with whitish phlegm, positive for occasional shortness of breath, no wheezing or hemoptysis.  CARDIOVASCULAR: No chest pain, orthopnea, edema.  GASTROINTESTINAL: Positive for nausea with dry heaving, positive for diarrhea and abdominal pain. No blood in bowel movements. GENITOURINARY: Some burning on urination. No hematuria  ENDOCRINE: No polyuria, nocturia,  HEMATOLOGY: No anemia, easy bruising or bleeding SKIN: No rash or lesion. MUSCULOSKELETAL: Right shoulder pain NEUROLOGIC: No tingling, numbness, weakness.  PSYCHIATRY: Positive for anxiety or depression.   MEDICATIONS AT HOME:   Prior to Admission medications   Medication Sig Start Date End Date Taking? Authorizing Provider  diazepam (VALIUM) 5 MG tablet Take 5 mg by mouth every 6 (six) hours as needed for anxiety.   Yes Historical Provider, MD  digoxin (LANOXIN) 0.125 MG tablet Take 0.125 mg by  mouth daily.   Yes Historical Provider, MD  diltiazem (DILACOR XR) 240 MG 24 hr capsule Take 240 mg by mouth daily.   Yes Historical Provider, MD  donepezil (ARICEPT) 5 MG tablet Take 5 mg by mouth at bedtime.   Yes Historical Provider, MD  escitalopram (LEXAPRO) 20 MG tablet Take 20 mg by mouth daily.   Yes Historical Provider, MD  furosemide (LASIX) 20 MG tablet Take 20 mg by mouth daily.    Yes Historical Provider, MD  pravastatin (PRAVACHOL) 10 MG tablet Take 10 mg by mouth at bedtime.    Yes Historical Provider, MD  rivaroxaban (XARELTO) 20 MG TABS tablet Take 20 mg by mouth daily with supper.   Yes Historical Provider, MD  zolpidem (AMBIEN) 10 MG tablet Take 10 mg by mouth at bedtime as needed for sleep.   Yes Historical Provider, MD      VITAL SIGNS:  Blood pressure 122/74, pulse 89, temperature 97.4 F (36.3 C), temperature source Oral, resp. rate 18, height 5\' 6"  (1.676 m), weight 71.668 kg (158 lb), SpO2 96 %.  PHYSICAL EXAMINATION:  GENERAL:  74 y.o.-year-old patient lying in the bed with no acute distress.  EYES: Pupils equal, round, reactive to light and accommodation. No scleral icterus. Extraocular muscles intact.  HEENT: Head atraumatic, normocephalic. Oropharynx and nasopharynx clear.  NECK:  Supple, no jugular venous distention. No thyroid enlargement, no tenderness.  LUNGS: Normal breath sounds bilaterally, no wheezing, rales,rhonchi or crepitation. No use of accessory muscles of respiration.  CARDIOVASCULAR: S1, S2 irregular. 2/6 systolic murmur, no rubs, or gallops.  ABDOMEN: Soft, tenderness generalized throughout entire abdomen, nondistended. Bowel sounds present. No organomegaly or mass.  EXTREMITIES: No pedal edema, cyanosis, or clubbing.  NEUROLOGIC: Cranial nerves II through XII are intact. Muscle strength 5/5 in all extremities. Sensation intact. Gait not checked.  PSYCHIATRIC: The patient is alert and oriented x 3.  SKIN: No rash, lesion, or ulcer.    LABORATORY PANEL:   CBC  Recent Labs Lab 02/21/15 0859  WBC 12.2*  HGB 16.6*  HCT 49.8*  PLT 196   ------------------------------------------------------------------------------------------------------------------  Chemistries   Recent Labs Lab 02/21/15 0859  NA 138  K 4.1  CL 103  CO2 26  GLUCOSE 129*  BUN 24*  CREATININE 1.00  CALCIUM 9.8  MG 1.8  AST 30  ALT 19  ALKPHOS 82  BILITOT 2.3*   ------------------------------------------------------------------------------------------------------------------    RADIOLOGY:  Ct Abdomen Pelvis W Contrast  02/21/2015   CLINICAL DATA:  Abdominal pain with acute onset diarrhea.  Diabetes  EXAM: CT ABDOMEN AND PELVIS WITH CONTRAST  TECHNIQUE: Multidetector CT imaging of the abdomen and pelvis was performed using the standard protocol following bolus administration of intravenous contrast.  CONTRAST:  117mL OMNIPAQUE IOHEXOL 300 MG/ML  SOLN  COMPARISON:  CT abdomen 02/17/2012 and 08/22/2005  FINDINGS: Lower chest: Lung bases clear. There is significant enlargement of the right atrium measuring 9 x 8.5 cm. This suggests tricuspid valve disease. Mild enlargement of the  right ventricle also noted.  Hepatobiliary: The liver capsule is mildly irregular suggesting mild hepatocellular disease, possible cirrhosis. Mild enlargement left lobe of the liver. Hypoechoic mass lesion appears extra hepatic just below the posterior hemidiaphragm. This indents the liver and may be extracapsular in location. The mass is homogeneous and measures 4.0 x 2.2 cm. Review of the study in 2013 reveals a questionable smaller density in this area which appears to have grown significantly. No other liver lesions. Possible small gallstones. Bile ducts nondilated.  Pancreas: Negative  Spleen: Negative  Adrenals/Urinary Tract: Negative for renal mass or obstruction. No adrenal mass. Scar of the left anterior renal cortex unchanged from 2013. No bladder lesion  identified.  Stomach/Bowel: Negative for bowel obstruction. No bowel mass or edema.  Vascular/Lymphatic: Atherosclerotic calcification aorta and iliac arteries without aneurysm.  Reproductive: Uterus not enlarged.  No adnexal mass.  Other: No other peritoneal masses. No ascites. Negative for adenopathy.  Musculoskeletal: Mild lumbar degenerative change. No acute bone lesion.  IMPRESSION: Possible cirrhosis of the liver which is mild.  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 This is subtly present in 2013 and has progressed. Differential includes benign and malignant tumor. Peritoneal metastatic disease could cause this appearance but no other lesions are identified and this appears to show slow growth. Further evaluation with MRI without with contrast suggested for further evaluation  Marked enlargement of the right atrium  Possible gallstones  Left renal infarct unchanged.   Electronically Signed   By: Franchot Gallo M.D.   On: 02/21/2015 10:58   US Abdomen Limited Ruq  02/21/2015   CLINICAL DATA:  Right upper quadrant pain with diarrhea for 1 day  EXAM: US ABDOMEN LIMITED - RIGHT UPPER QUADRANT  COMPARISON:  CT abdomen and pelvis February 21, 2015  FINDINGS: Gallbladder:  Within the gallbladder, there are multiple small echogenic foci which move and shadow consistent with cholelithiasis none. The largest of these foci measures 9 mm. There is no gallbladder wall thickening or pericholecystic fluid. No sonographic Murphy sign noted.  Common bile duct:  Diameter: 5 mm. No intrahepatic or extrahepatic biliary duct dilatation.  Liver:  There is a hypoechoic mass along the posterior superior aspect of the right lobe of the liver measuring 3.0 x 2.1 x 2.6 cm corresponding to the lesions seen by CT. No other focal liver lesions are identified on this study. The liver echogenicity is mildly increased with a subtly nodular contour to the liver suggesting underlying cirrhosis.  IMPRESSION:  Cholelithiasis.  No biliary duct dilatation.  Mass along the dome of the liver, hypoechoic but not indicative of a simple cyst, corresponding to the lesions seen earlier in the day on CT. The appearance the liver suggests underlying parenchymal disease. The sensitivity of ultrasound for focal liver lesions is diminished in this circumstance. Correlation with pre and post-contrast liver MR to further evaluate the lesion arising in the liver dome region is advisable.   Electronically Signed   By: Lowella Grip III M.D.   On: 02/21/2015 12:44    EKG:   Atrial fibrillation flipped T waves inferiorly and laterally  IMPRESSION AND PLAN:   1. Diarrhea and abdominal pain. So far, stool for C. difficile is negative and no Campylobacter has been seen on the stool culture. Conservative management at this point awaiting further stool studies. I will start on liquid diet and give IV fluid hydration. Patient is allergic to oxycodone which makes pain management a little more difficult.  2. Liver mass- I will get an MRI of the abdomen for further evaluation. This may end up needing a biopsy. She would have to be off Xarelto in order to do a biopsy though. I will send off hepatitis profiles and an alpha-fetoprotein. 3. Atrial fibrillation currently rate controlled, on Xarelto for anticoagulation 4. Essential hypertension- continue usual medications 5. Hyperlipidemia unspecified- on pravastatin 6. Diabetes type 2- diet controlled 7. Anxiety depression- continue usual medications   All the records are reviewed and case discussed with ED provider. Management plans discussed with the patient, family and they are in agreement.  CODE STATUS: Full code  TOTAL TIME TAKING CARE OF THIS PATIENT: 50 minutes.    Loletha Grayer M.D on 02/21/2015 at 2:13 PM  Between 7am to 6pm - Pager - [phone]  After 6pm call admission pager [facility]  Office  [phone]  CC: Primary care  physician; Idelle Crouch, MD

## 2015-02-21 NOTE — ED Notes (Signed)
Pt states hx of IBS. Pt states that she was seen in Dr. Doy Hutching office yesterday and was given Librax. Per daughter pt took 1 dose about dinner time yesterday and at approx 0200 began having diarrhea.

## 2015-02-22 LAB — BASIC METABOLIC PANEL
ANION GAP: 4 — AB (ref 5–15)
BUN: 13 mg/dL (ref 6–20)
CALCIUM: 7.9 mg/dL — AB (ref 8.9–10.3)
CHLORIDE: 113 mmol/L — AB (ref 101–111)
CO2: 21 mmol/L — AB (ref 22–32)
Creatinine, Ser: 0.86 mg/dL (ref 0.44–1.00)
GFR calc non Af Amer: >60 mL/min (ref >60)
Glucose, Bld: 106 mg/dL — ABNORMAL HIGH (ref 65–99)
Potassium: 3.3 mmol/L — ABNORMAL LOW (ref 3.5–5.1)
SODIUM: 138 mmol/L (ref 135–145)

## 2015-02-22 LAB — CBC
HCT: 39.9 % (ref 35.0–47.0)
HEMOGLOBIN: 13.7 g/dL (ref 12.0–16.0)
MCH: 30.8 pg (ref 26.0–34.0)
MCHC: 34.3 g/dL (ref 32.0–36.0)
MCV: 89.8 fL (ref 80.0–100.0)
PLATELETS: 128 10*3/uL — AB (ref 150–440)
RBC: 4.44 MIL/uL (ref 3.80–5.20)
RDW: 16.1 % — ABNORMAL HIGH (ref 11.5–14.5)
WBC: 6.8 10*3/uL (ref 3.6–11.0)

## 2015-02-22 MED ORDER — DICYCLOMINE HCL 20 MG PO TABS
20.0000 mg | ORAL_TABLET | Freq: Three times a day (TID) | ORAL | Status: DC
  Administered 2015-02-22 – 2015-02-25 (×12): 20 mg via ORAL
  Filled 2015-02-22 (×12): qty 1

## 2015-02-22 MED ORDER — FUROSEMIDE 10 MG/ML IJ SOLN: 20.0000 mg | Freq: Every day | INTRAMUSCULAR | Status: DC | PRN

## 2015-02-22 MED ORDER — POTASSIUM CHLORIDE CRYS ER 20 MEQ PO TBCR
20.0000 meq | EXTENDED_RELEASE_TABLET | Freq: Three times a day (TID) | ORAL | Status: DC
  Administered 2015-02-22 – 2015-02-25 (×10): 20 meq via ORAL
  Filled 2015-02-22 (×10): qty 1

## 2015-02-22 MED ORDER — DILTIAZEM HCL 30 MG PO TABS
60.0000 mg | ORAL_TABLET | Freq: Three times a day (TID) | ORAL | Status: DC
  Administered 2015-02-23 – 2015-02-25 (×7): 60 mg via ORAL
  Filled 2015-02-22 (×8): qty 2

## 2015-02-22 MED ORDER — PANTOPRAZOLE SODIUM 40 MG IV SOLR
40.0000 mg | Freq: Two times a day (BID) | INTRAVENOUS | Status: DC
  Administered 2015-02-22 – 2015-02-24 (×6): 40 mg via INTRAVENOUS
  Filled 2015-02-22 (×6): qty 40

## 2015-02-22 MED ORDER — ENOXAPARIN SODIUM 40 MG/0.4ML ~~LOC~~ SOLN
40.0000 mg | SUBCUTANEOUS | Status: DC
  Administered 2015-02-22 – 2015-02-24 (×3): 40 mg via SUBCUTANEOUS
  Filled 2015-02-22 (×3): qty 0.4

## 2015-02-22 NOTE — Care Management Note (Signed)
Case Management Note  Patient Details  Name: SHARIA AVERITT MRN: [id number] Date of Birth: 10-May-1941  Subjective/Objective:      74yo Ms Chardonay [place] was admitted to an Observation bed on 02/21/15 per abdominal pain and watery diarrhea. Ultrasound and CT shows a liver mass. On chronic Xaralto per A-Fib. PCP=DR Sparks. Pharmacy=[facility] on [address]. Resides with her husband who she reports has multiple medical issues and she is his caretaker. While Ms Camille is hospitalized her daughter is caring for Mr [place]. Home equipment includes a rolling walker and a bedside commode. No home oxygen. [facility] followed Ms Sinha two years ago after her knee surgery, but nether she or her husband currently have home health services. Daughter provides transportation for Ms [facility] Provided Ms Chavana with a list of home health providers from which to choose but she did not make a decision today. Case management will follow for discharge planning.               Action/Plan:   Expected Discharge Date:                  Expected Discharge Plan:     In-House Referral:     Discharge planning Services     Post Acute Care Choice:    Choice offered to:     DME Arranged:    DME Agency:     HH Arranged:    Lydia Agency:     Status of Service:     Medicare Important Message Given:    Date Medicare IM Given:    Medicare IM give by:    Date Additional Medicare IM Given:    Additional Medicare Important Message give by:     If discussed at [place] of Stay Meetings, dates discussed:    Additional Comments:  Katey Barrie A, RN 02/22/2015, 4:01 PM

## 2015-02-22 NOTE — Plan of Care (Signed)
Problem: Discharge Progression Outcomes Goal: Other Discharge Outcomes/Goals 1. Plans to return home to self care. 2. Denies pain. 3. Reports nausea with zofran given with some relief generalized weakness, VSS. Continues to have loose stools though less frequent  IVF's continued. 4. Tolerated full liquid diet well; no emesis. Does report decreased appetite. 5. Up to BR- generalized weakness; mildly unsteady. Has urgency/frequency.

## 2015-02-22 NOTE — Progress Notes (Signed)
TC current set of  vital sign's with review of todays's readings, update on pt becoming weak and near faint like when coming back from BR with [facility] now being used, urinary output beginning to improve, heart rate decreased/irregular and pronounced heart murmur. New orders obtained. Will continue to monitor pt closely with BP, HR/lung sounds. Will give lasix if indicated. 2nd Gatorade ordered for today.

## 2015-02-22 NOTE — Progress Notes (Signed)
[name] [name] is a 74 y.o. female   SUBJECTIVE:  Patient admitted with several weeks of profound watery diarrhea. Has epigastric discomfort. Symptoms come on after eating typically, no fever no chills, C. difficile negative. Extrahepatic liver mass noted. Diarrhea continues this morning, 4 times so far.  ______________________________________________________________________  ROS: Review of systems is unremarkable for any active cardiac,respiratory, GI, GU, hematologic, neurologic or psychiatric systems, 10 systems reviewed.  . dicyclomine  20 mg Oral TID AC & HS  . digoxin  0.125 mg Oral Daily  . diltiazem  240 mg Oral Daily  . donepezil  5 mg Oral QHS  . escitalopram  20 mg Oral Daily  . pantoprazole (PROTONIX) IV  40 mg Intravenous Q12H  . potassium chloride  20 mEq Oral TID  . pravastatin  10 mg Oral QHS  . rivaroxaban  20 mg Oral Q supper   acetaminophen **OR** acetaminophen, diazepam, ondansetron (ZOFRAN) IV, zolpidem   Past Medical History  Diagnosis Date  . Shortness of breath dyspnea   . Dysrhythmia     afib  . Coronary artery disease   . Hypertension   . Heart murmur   . Arthritis   . Depression   . Diabetes mellitus without complication ([place])   . IBS (irritable bowel syndrome)   . Atrial fibrillation [facility])     Past Surgical History  Procedure Laterality Date  . Coronary angioplasty    . Eye surgery    . Joint replacement Right     tkr  . Shoulder arthroscopy with rotator cuff repair    . Cataract extraction w/phaco Right 01/13/2015    Procedure: CATARACT EXTRACTION PHACO AND INTRAOCULAR LENS PLACEMENT (IOC);  Surgeon: Birder Robson, MD;  Location: [facility] ORS;  Service: Ophthalmology;  Laterality: Right;  US:01:15.6 AP:19.6 CDE:14.84 LOT PACK #[id number] H    PHYSICAL EXAM:  BP 97/60 mmHg  Pulse 92  Temp(Src) 98.1 F (36.7 C) (Oral)  Resp 20  Ht 5\' 6"  (1.676 m)  Wt 71.668 kg (158 lb)  BMI 25.51 kg/m2  SpO2 91%  Wt Readings from Last 3 Encounters:   02/21/15 71.668 kg (158 lb)  01/01/15 71.668 kg (158 lb)           BP Readings from Last 3 Encounters:  02/22/15 97/60  01/13/15 104/57    Constitutional: NAD Neck: supple, no thyromegaly Respiratory: CTA, no rales or wheezes Cardiovascular: RRR, no murmur, no gallop Abdomen: soft, poor bowel sounds, generalized tenderness, mostly epigastric, no rebound Extremities: no edema Neuro: alert and oriented, no focal motor or sensory deficits  ASSESSMENT/PLAN:  Labs and imaging studies were reviewed  Profound diarrhea-culture pending, C. difficile negative, CT negative, IV Protonix, Bentyl 4 times a day, GI consult Extrahepatic liver mass-appears to be benign fluid collection, seen by ultrasound, CT, MRI Atrial fibrillation-holding Xarelto as patient will likely need endoscopy, rate controlled, Xarelto could be contributing to the diarrhea.

## 2015-02-22 NOTE — Plan of Care (Signed)
Problem: Discharge Progression Outcomes Goal: Other Discharge Outcomes/Goals Outcome: Progressing 1. Plans to return home with family support.  2. Denies pain currently. 3. K+ 3.3 with oral potassium started in addition to IVF's with K+. Continues to have profuse watery diarrhea stools with decreased urinary output with borderline hypotension lying- Dr. Sabra Heck made aware with IVF's increased to 125/ml/hr. Pt comes weak upon return from BR which resolves with rest. GI consult submitted. Bilirubin 2.3 with color pale/sallow. Abdomen soft, nontender, distended. 4. Zofran given IV for nausea with eating and when up to BR; no emesis. Requires 1+ when up to BR due to weakness. Experiencing partial incontinence with diarrhea at intervals. 5. Tolerating full liquid with decreased appetite. 6. Becomes weak, pale upon returning to bed from BR which resolves with rest.

## 2015-02-23 DIAGNOSIS — R197 Diarrhea, unspecified: Secondary | ICD-10-CM

## 2015-02-23 LAB — CBC WITH DIFFERENTIAL/PLATELET
BASOS ABS: 0 10*3/uL (ref 0–0.1)
BASOS PCT: 0 %
EOS PCT: 1 %
Eosinophils Absolute: 0.1 10*3/uL (ref 0–0.7)
HCT: 42.3 % (ref 35.0–47.0)
Hemoglobin: 13.9 g/dL (ref 12.0–16.0)
Lymphocytes Relative: 12 %
Lymphs Abs: 1 10*3/uL (ref 1.0–3.6)
MCH: 29.8 pg (ref 26.0–34.0)
MCHC: 32.8 g/dL (ref 32.0–36.0)
MCV: 90.8 fL (ref 80.0–100.0)
MONO ABS: 0.7 10*3/uL (ref 0.2–0.9)
Monocytes Relative: 8 %
Neutro Abs: 6.6 10*3/uL — ABNORMAL HIGH (ref 1.4–6.5)
Neutrophils Relative %: 79 %
PLATELETS: 121 10*3/uL — AB (ref 150–440)
RBC: 4.66 MIL/uL (ref 3.80–5.20)
RDW: 16.2 % — AB (ref 11.5–14.5)
WBC: 8.4 10*3/uL (ref 3.6–11.0)

## 2015-02-23 LAB — COMPREHENSIVE METABOLIC PANEL
ALBUMIN: 3.4 g/dL — AB (ref 3.5–5.0)
ALT: 21 U/L (ref 14–54)
ANION GAP: 4 — AB (ref 5–15)
AST: 28 U/L (ref 15–41)
Alkaline Phosphatase: 59 U/L (ref 38–126)
BUN: 9 mg/dL (ref 6–20)
CO2: 20 mmol/L — ABNORMAL LOW (ref 22–32)
Calcium: 8.5 mg/dL — ABNORMAL LOW (ref 8.9–10.3)
Chloride: 115 mmol/L — ABNORMAL HIGH (ref 101–111)
Creatinine, Ser: 0.91 mg/dL (ref 0.44–1.00)
GFR calc Af Amer: >60 mL/min (ref >60)
Glucose, Bld: 110 mg/dL — ABNORMAL HIGH (ref 65–99)
POTASSIUM: 4.5 mmol/L (ref 3.5–5.1)
Sodium: 139 mmol/L (ref 135–145)
Total Bilirubin: 0.9 mg/dL (ref 0.3–1.2)
Total Protein: 6.3 g/dL — ABNORMAL LOW (ref 6.5–8.1)

## 2015-02-23 LAB — MAGNESIUM: MAGNESIUM: 1.6 mg/dL — AB (ref 1.7–2.4)

## 2015-02-23 MED ORDER — PEG 3350-KCL-NABCB-NACL-NASULF 236 G PO SOLR
4000.0000 mL | Freq: Once | ORAL | Status: AC
  Administered 2015-02-23: 4000 mL via ORAL
  Filled 2015-02-23 (×2): qty 4000

## 2015-02-23 MED ORDER — MAGNESIUM SULFATE 2 GM/50ML IV SOLN
2.0000 g | Freq: Once | INTRAVENOUS | Status: AC
  Administered 2015-02-23: 2 g via INTRAVENOUS
  Filled 2015-02-23 (×2): qty 50

## 2015-02-23 NOTE — Plan of Care (Signed)
Problem: Discharge Progression Outcomes Goal: Other Discharge Outcomes/Goals Outcome: Progressing Plan of care progress to goal: BP runs low and occasionally de-sats into the mid-upper 80s, but recovers quickly Pt has PRN valium as needed for anxiety. Full liquid diet - fair appetite

## 2015-02-23 NOTE — Progress Notes (Signed)
Patient very anxious this shift about when she would be able to discharge.  Dr. Allen Norris consulted and colonscopy scheduled for tomorrow.  Consents signed.  Patient to start colon prep this evening for tomorrow.  No complaints of pain and no visible evidence of diarrhea this shift.

## 2015-02-23 NOTE — Progress Notes (Signed)
Initial Nutrition Assessment   INTERVENTION:   Coordination of Care: await diet progression as medically able. Will send jello with Full liquid meal tray at lunch today. Medical Food Supplement Therapy: will recommend on follow if unable to advance diet order or po intake poor   NUTRITION DIAGNOSIS:   Inadequate oral intake related to acute illness as evidenced by per patient/family report.  GOAL:   Patient will meet greater than or equal to 90% of their needs  MONITOR:    (Energy Intake, Electrolyte and renal Profile, Digestive System, Anthropometrics)  REASON FOR ASSESSMENT:   Malnutrition Screening Tool    ASSESSMENT:   Pt admitted with diarrhea, c.diff negative. Per MD note, colonscopy tomorrow. Also per MD note, CT consistent with liver mass as well.   Past Medical History  Diagnosis Date  . Shortness of breath dyspnea   . Dysrhythmia     afib  . Coronary artery disease   . Hypertension   . Heart murmur   . Arthritis   . Depression   . Diabetes mellitus without complication ([place])   . IBS (irritable bowel syndrome)   . Atrial fibrillation ([place])     Diet Order:  Diet full liquid Room service appropriate?: Yes; Fluid consistency:: Thin    Current Nutrition: Pt reports tolerating some yogurt this am and reports having soups and ice cream from meal trays.  Food/Nutrition-Related History: Pt reports poor po intake for the past few days PTA. Pt reports previously having a good appetite always eating 3 meals per day.    Medications: NS with KCl at 149mL/hr, KCl, Protonix  Electrolyte/Renal Profile and Glucose Profile:   Recent Labs Lab 02/21/15 0859 02/22/15 0429 02/23/15 0440  NA 138 138 139  K 4.1 3.3* 4.5  CL 103 113* 115*  CO2 26 21* 20*  BUN 24* 13 9  CREATININE 1.00 0.86 0.91  CALCIUM 9.8 7.9* 8.5*  MG 1.8  --  1.6*  GLUCOSE 129* 106* 110*   Protein Profile:  Recent Labs Lab 02/21/15 0859 02/23/15 0440  ALBUMIN 5.0 3.4*     Gastrointestinal Profile: Last BM:  10/3 +diarrhea, c.diff negative   Nutrition-Focused Physical Exam Findings:  Unable to complete Nutrition-Focused physical exam at this time.    Weight Change: Pt reports weight this am was 156lbs however writer does not see this documented. Pt in chair on visit, unable to reweigh. Pt reports weight os 160lbs last week at outpatient MD office. Pt reports weight of 185lbs 2 years ago (15% weight loss in 2 years), noted weight  2 months ago stable per [facility].   Skin:  Reviewed, no issues   Height:   Ht Readings from Last 1 Encounters:  02/21/15 5\' 6"  (1.676 m)    Weight:   Wt Readings from Last 1 Encounters:  02/21/15 158 lb (71.668 kg)    Wt Readings from Last 10 Encounters:  02/21/15 158 lb (71.668 kg)  01/01/15 158 lb (71.668 kg)    BMI:  Body mass index is 25.51 kg/(m^2).  Estimated Nutritional Needs:   Kcal:  BEE: 1241kcals, TEE: (IF 1.1-1.3)(AF 1.2) 1639-1937kcals  Protein:  58-72g protein (0.8-1.0g/kg)   Fluid:  1800-2147mL of fluid (25-31mL/kg)  EDUCATION NEEDS:   Education needs no appropriate at this time   Sparks, RD, LDN Pager [phone]

## 2015-02-23 NOTE — Progress Notes (Signed)
   02/23/15 1045  Clinical Encounter Type  Visited With Patient and family together  Visit Type Initial  Provided pastoral presence and support to patient on unit.  [place] [phone]

## 2015-02-23 NOTE — Progress Notes (Signed)
[name] [name] is a 74 y.o. female  <principal problem not specified>   SUBJECTIVE:  Pt still with sweats/chills and diarrhea. Labs OK except magnesium low. No fever. WBC normal Cultures NTD.   ______________________________________________________________________  ROS: Review of systems is unremarkable for any active cardiac,respiratory, GI, GU, hematologic, neurologic or psychiatric systems, 10 systems reviewed.  @CMEDLIST @  Past Medical History  Diagnosis Date  . Shortness of breath dyspnea   . Dysrhythmia     afib  . Coronary artery disease   . Hypertension   . Heart murmur   . Arthritis   . Depression   . Diabetes mellitus without complication ([place])   . IBS (irritable bowel syndrome)   . Atrial fibrillation [facility])     Past Surgical History  Procedure Laterality Date  . Coronary angioplasty    . Eye surgery    . Joint replacement Right     tkr  . Shoulder arthroscopy with rotator cuff repair    . Cataract extraction w/phaco Right 01/13/2015    Procedure: CATARACT EXTRACTION PHACO AND INTRAOCULAR LENS PLACEMENT (IOC);  Surgeon: Birder Robson, MD;  Location: [facility] ORS;  Service: Ophthalmology;  Laterality: Right;  US:01:15.6 AP:19.6 CDE:14.84 LOT PACK #[id number] H    PHYSICAL EXAM:  BP 101/57 mmHg  Pulse 86  Temp(Src) 98.9 F (37.2 C) (Oral)  Resp 16  Ht 5\' 6"  (1.676 m)  Wt 71.668 kg (158 lb)  BMI 25.51 kg/m2  SpO2 93%  Wt Readings from Last 3 Encounters:  02/21/15 71.668 kg (158 lb)  01/01/15 71.668 kg (158 lb)            Constitutional: NAD Neck: supple, no thyromegaly Respiratory: CTA, no rales or wheezes Cardiovascular: RRR, no murmur, no gallop Abdomen: soft, good BS, nontender Extremities: no edema Neuro: alert and oriented, no focal motor or sensory deficits  ASSESSMENT/PLAN:  Labs and imaging studies were reviewed  Will supplement magnesium. GI consult today. Repeat labs in Am.

## 2015-02-23 NOTE — Consult Note (Signed)
[facility]  [address] [place], [place] [address] Phone: [phone] Fax : [phone]  Consultation  Referring Provider:     No ref. provider found Primary Care Physician:  Idelle Crouch, MD Primary Gastroenterologist:  Dr. Laurell Josephs         Reason for Consultation:     Diarrhea  Date of Admission:  02/21/2015 Date of Consultation:  02/23/2015         HPI:   [name] [name] is a 74 y.o. female who reports that she has had diarrhea for the last 4 months. She says she came in because it got worse. She told the hospitalist that she was having diarrhea 20 times a day but tells me it was 5 times a day with only 3 times a day for the last 4 days. She reports that she takes care of her husband who has multiple medical problems. The patient also reports that she was tried on Librax without any help from the Librax with her diarrhea. The patient states that her diarrhea has woken her up a few times while she was sleeping but more commonly she has to go to urinate and another night and very rarely has to have diarrhea. The patient also denies being on any antibiotics in the last couple of months. She also had a colonoscopy 3 years ago with a polyp removed at that time. She states she is due for another colonoscopy soon.  Past Medical History  Diagnosis Date  . Shortness of breath dyspnea   . Dysrhythmia     afib  . Coronary artery disease   . Hypertension   . Heart murmur   . Arthritis   . Depression   . Diabetes mellitus without complication ([place])   . IBS (irritable bowel syndrome)   . Atrial fibrillation [facility])     Past Surgical History  Procedure Laterality Date  . Coronary angioplasty    . Eye surgery    . Joint replacement Right     tkr  . Shoulder arthroscopy with rotator cuff repair    . Cataract extraction w/phaco Right 01/13/2015    Procedure: CATARACT EXTRACTION PHACO AND INTRAOCULAR LENS PLACEMENT (IOC);  Surgeon: Birder Robson, MD;  Location:  [facility] ORS;  Service: Ophthalmology;  Laterality: Right;  US:01:15.6 AP:19.6 CDE:14.84 LOT PACK #[id number] H    Prior to Admission medications   Medication Sig Start Date End Date Taking? Authorizing Provider  clidinium-chlordiazePOXIDE (LIBRAX) 5-2.5 MG capsule Take 1 capsule by mouth 2 (two) times daily. 02/20/15 03/22/15 Yes Historical Provider, MD  diazepam (VALIUM) 5 MG tablet Take 5 mg by mouth every 6 (six) hours as needed for anxiety.   Yes Historical Provider, MD  digoxin (LANOXIN) 0.125 MG tablet Take 0.125 mg by mouth daily.   Yes Historical Provider, MD  diltiazem (DILACOR XR) 240 MG 24 hr capsule Take 240 mg by mouth daily.   Yes Historical Provider, MD  donepezil (ARICEPT) 5 MG tablet Take 5 mg by mouth at bedtime.   Yes Historical Provider, MD  escitalopram (LEXAPRO) 20 MG tablet Take 20 mg by mouth daily.   Yes Historical Provider, MD  furosemide (LASIX) 20 MG tablet Take 20 mg by mouth daily.    Yes Historical Provider, MD  pravastatin (PRAVACHOL) 10 MG tablet Take 10 mg by mouth at bedtime.    Yes Historical Provider, MD  rivaroxaban (XARELTO) 20 MG TABS tablet Take 20 mg by mouth daily with supper.   Yes Historical Provider, MD  zolpidem (AMBIEN) 10 MG tablet Take 10 mg by mouth at bedtime as needed for sleep.   Yes Historical Provider, MD    Family History  Problem Relation Age of Onset  . Alzheimer's disease Mother   . Hypertension Mother   . Stroke Mother   . Alzheimer's disease Father   . Hypertension Father   . Stroke Father   . Diabetes Father      Social History  Substance Use Topics  . Smoking status: Former Research scientist (life sciences)  . Smokeless tobacco: Never Used  . Alcohol Use: No    Allergies as of 02/21/2015 - Review Complete 02/21/2015  Allergen Reaction Noted  . Levaquin [levofloxacin in d5w] Other (See Comments) 01/01/2015  . Oxycodone Other (See Comments) 02/21/2015  . Phenergan [promethazine hcl]  01/01/2015  . Septra [sulfamethoxazole-trimethoprim]  01/01/2015   . Tetracyclines & related  01/01/2015    Review of Systems:    All systems reviewed and negative except where noted in HPI.   Physical Exam:  Vital signs in last 24 hours: Temp:  [97.6 F (36.4 C)-99.5 F (37.5 C)] 97.8 F (36.6 C) (10/03 0913) Pulse Rate:  [54-89] 88 (10/03 0942) Resp:  [16-20] 20 (10/03 0913) BP: (91-116)/(54-78) 105/59 mmHg (10/03 0913) SpO2:  [86 %-94 %] 90 % (10/03 0913) Last BM Date: 02/23/15 General:   Pleasant, cooperative in NAD Head:  Normocephalic and atraumatic. Eyes:   No icterus.   Conjunctiva pink. PERRLA. Ears:  Normal auditory acuity. Neck:  Supple; no masses or thyroidomegaly Lungs: Respirations even and unlabored. Lungs clear to auscultation bilaterally.   No wheezes, crackles, or rhonchi.  Heart:  Regular rate and rhythm;  Without murmur, clicks, rubs or gallops Abdomen:  Soft, nondistended, nontender. Normal bowel sounds. No appreciable masses or hepatomegaly.  No rebound or guarding.  Rectal:  Not performed. Msk:  Symmetrical without gross deformities.  Strength  Extremities:  Without edema, cyanosis or clubbing. Neurologic:  Alert and oriented x3;  grossly normal neurologically. Skin:  Intact without significant lesions or rashes. Cervical Nodes:  No significant cervical adenopathy. Psych:  Alert and cooperative. Normal affect.  LAB RESULTS:  Recent Labs  02/21/15 0859 02/22/15 0429 02/23/15 0440  WBC 12.2* 6.8 8.4  HGB 16.6* 13.7 13.9  HCT 49.8* 39.9 42.3  PLT 196 128* 121*   BMET  Recent Labs  02/21/15 0859 02/22/15 0429 02/23/15 0440  NA 138 138 139  K 4.1 3.3* 4.5  CL 103 113* 115*  CO2 26 21* 20*  GLUCOSE 129* 106* 110*  BUN 24* 13 9  CREATININE 1.00 0.86 0.91  CALCIUM 9.8 7.9* 8.5*   LFT  Recent Labs  02/23/15 0440  PROT 6.3*  ALBUMIN 3.4*  AST 28  ALT 21  ALKPHOS 59  BILITOT 0.9   PT/INR No results for input(s): LABPROT, INR in the last 72 hours.  STUDIES: Mr Abdomen W Wo  Contrast  02/21/2015   CLINICAL DATA:  CT and ultrasound demonstrating hepatic dome lesion. Abdominal pain for 3 weeks. Severe diarrhea.  EXAM: MRI ABDOMEN WITHOUT AND WITH CONTRAST  TECHNIQUE: Multiplanar multisequence MR imaging of the abdomen was performed both before and after the administration of intravenous contrast.  CONTRAST:  35mL MULTIHANCE GADOBENATE DIMEGLUMINE 529 MG/ML IV SOLN  COMPARISON:  CT and ultrasound of earlier today.  FINDINGS: Portions of exam are mildly motion degraded.  Lower chest: Normal heart size without pericardial or pleural effusion.  Hepatobiliary: Hepatomegaly, 20.9 cm craniocaudal. Irregular hepatic capsule. Corresponding the CT abnormality,  within the subcapsular right perihepatic space is a 3.4 x 2.0 x 3.9 cm fluid signal structure without post-contrast enhancement. No suspicious parenchymal liver lesions. Normal gallbladder, without biliary ductal dilatation.  Pancreas: Normal, without mass or ductal dilatation.  Spleen: Normal in size, without focal abnormality.  Adrenals/Urinary Tract: Normal adrenal glands. Normal right kidney. Scarred upper and interpolar left kidney. No hydronephrosis.  Stomach/Bowel: Normal stomach and abdominal bowel loops.  Vascular/Lymphatic: Normal caliber of the aorta and branch vessels. Patent hepatic veins and portal vein. No retroperitoneal or retrocrural adenopathy.  Other: No ascites.  Musculoskeletal: Convex right lumbar spine curvature is mild.  IMPRESSION: 1. The CT abnormality represents a a perihepatic, subcapsular simple fluid collection which could be related to remote trauma or biopsy. No suspicious parenchymal liver lesion. Hepatomegaly. Suspicion of mild cirrhosis. 2. Otherwise, motion degraded exam.   Electronically Signed   By: Abigail Miyamoto M.D.   On: 02/21/2015 21:52   US Abdomen Limited Ruq  02/21/2015   CLINICAL DATA:  Right upper quadrant pain with diarrhea for 1 day  EXAM: US ABDOMEN LIMITED - RIGHT UPPER QUADRANT   COMPARISON:  CT abdomen and pelvis February 21, 2015  FINDINGS: Gallbladder:  Within the gallbladder, there are multiple small echogenic foci which move and shadow consistent with cholelithiasis none. The largest of these foci measures 9 mm. There is no gallbladder wall thickening or pericholecystic fluid. No sonographic Murphy sign noted.  Common bile duct:  Diameter: 5 mm. No intrahepatic or extrahepatic biliary duct dilatation.  Liver:  There is a hypoechoic mass along the posterior superior aspect of the right lobe of the liver measuring 3.0 x 2.1 x 2.6 cm corresponding to the lesions seen by CT. No other focal liver lesions are identified on this study. The liver echogenicity is mildly increased with a subtly nodular contour to the liver suggesting underlying cirrhosis.  IMPRESSION: Cholelithiasis.  No biliary duct dilatation.  Mass along the dome of the liver, hypoechoic but not indicative of a simple cyst, corresponding to the lesions seen earlier in the day on CT. The appearance the liver suggests underlying parenchymal disease. The sensitivity of ultrasound for focal liver lesions is diminished in this circumstance. Correlation with pre and post-contrast liver MR to further evaluate the lesion arising in the liver dome region is advisable.   Electronically Signed   By: Lowella Grip III M.D.   On: 02/21/2015 12:44      Impression / Plan:   [name] [name] is a 74 y.o. y/o female with with a history of 4 months of diarrhea that she states was 5 times a day before she was admitted to the hospital and then started happening 3 times a day. She will be set up for colonoscopy for tomorrow to rule out microscopic colitis.I have discussed risks & benefits which include, but are not limited to, bleeding, infection, perforation & drug reaction.  The patient agrees with this plan & written consent will be obtained.      Thank you for involving me in the care of this patient.        Ollen Bowl, MD   02/23/2015, 12:04 PM   Note: This dictation was prepared with Dragon dictation along with smaller phrase technology. Any transcriptional errors that result from this process are unintentional.

## 2015-02-24 ENCOUNTER — Encounter: Payer: Self-pay | Admitting: Anesthesiology

## 2015-02-24 ENCOUNTER — Observation Stay: Payer: Commercial Managed Care - HMO | Admitting: Anesthesiology

## 2015-02-24 ENCOUNTER — Encounter
Admission: EM | Disposition: A | Discharge status: Home / Self Care | Payer: Self-pay | Source: Home / Self Care | Attending: Emergency Medicine

## 2015-02-24 DIAGNOSIS — K529 Noninfective gastroenteritis and colitis, unspecified: Secondary | ICD-10-CM | POA: Diagnosis not present

## 2015-02-24 DIAGNOSIS — D120 Benign neoplasm of cecum: Secondary | ICD-10-CM | POA: Diagnosis not present

## 2015-02-24 DIAGNOSIS — K629 Disease of anus and rectum, unspecified: Secondary | ICD-10-CM | POA: Diagnosis not present

## 2015-02-24 DIAGNOSIS — R197 Diarrhea, unspecified: Secondary | ICD-10-CM | POA: Diagnosis not present

## 2015-02-24 HISTORY — PX: COLONOSCOPY WITH PROPOFOL: SHX5780

## 2015-02-24 LAB — CBC WITH DIFFERENTIAL/PLATELET
BASOS PCT: 1 %
Basophils Absolute: 0 10*3/uL (ref 0–0.1)
Eosinophils Absolute: 0.1 10*3/uL (ref 0–0.7)
Eosinophils Relative: 1 %
HEMATOCRIT: 43.4 % (ref 35.0–47.0)
Hemoglobin: 14 g/dL (ref 12.0–16.0)
LYMPHS ABS: 1 10*3/uL (ref 1.0–3.6)
Lymphocytes Relative: 13 %
MCH: 29.2 pg (ref 26.0–34.0)
MCHC: 32.4 g/dL (ref 32.0–36.0)
MCV: 90.3 fL (ref 80.0–100.0)
MONO ABS: 0.5 10*3/uL (ref 0.2–0.9)
MONOS PCT: 7 %
NEUTROS ABS: 6.2 10*3/uL (ref 1.4–6.5)
Neutrophils Relative %: 78 %
Platelets: 138 10*3/uL — ABNORMAL LOW (ref 150–440)
RBC: 4.8 MIL/uL (ref 3.80–5.20)
RDW: 16.2 % — AB (ref 11.5–14.5)
WBC: 7.9 10*3/uL (ref 3.6–11.0)

## 2015-02-24 LAB — STOOL CULTURE

## 2015-02-24 LAB — COMPREHENSIVE METABOLIC PANEL
ALBUMIN: 4.1 g/dL (ref 3.5–5.0)
ALT: 23 U/L (ref 14–54)
ANION GAP: 7 (ref 5–15)
AST: 29 U/L (ref 15–41)
Alkaline Phosphatase: 68 U/L (ref 38–126)
BILIRUBIN TOTAL: 1.3 mg/dL — AB (ref 0.3–1.2)
BUN: 7 mg/dL (ref 6–20)
CALCIUM: 8.8 mg/dL — AB (ref 8.9–10.3)
CO2: 22 mmol/L (ref 22–32)
Chloride: 111 mmol/L (ref 101–111)
Creatinine, Ser: 0.83 mg/dL (ref 0.44–1.00)
Glucose, Bld: 100 mg/dL — ABNORMAL HIGH (ref 65–99)
POTASSIUM: 4.1 mmol/L (ref 3.5–5.1)
Sodium: 140 mmol/L (ref 135–145)
TOTAL PROTEIN: 7.4 g/dL (ref 6.5–8.1)

## 2015-02-24 LAB — GLUCOSE, CAPILLARY: Glucose-Capillary: 90 mg/dL (ref 65–99)

## 2015-02-24 LAB — MAGNESIUM: Magnesium: 1.9 mg/dL (ref 1.7–2.4)

## 2015-02-24 SURGERY — COLONOSCOPY WITH PROPOFOL
Anesthesia: General

## 2015-02-24 MED ORDER — PROPOFOL 10 MG/ML IV BOLUS
INTRAVENOUS | Status: DC | PRN
  Administered 2015-02-24: 30 mg via INTRAVENOUS
  Administered 2015-02-24: 20 mg via INTRAVENOUS

## 2015-02-24 MED ORDER — FENTANYL CITRATE (PF) 100 MCG/2ML IJ SOLN
INTRAMUSCULAR | Status: DC | PRN
  Administered 2015-02-24 (×2): 25 ug via INTRAVENOUS

## 2015-02-24 MED ORDER — SODIUM CHLORIDE 0.9 % IV SOLN
INTRAVENOUS | Status: DC
  Administered 2015-02-24: 09:00:00 via INTRAVENOUS

## 2015-02-24 MED ORDER — PROPOFOL 500 MG/50ML IV EMUL
INTRAVENOUS | Status: DC | PRN
  Administered 2015-02-24: 50 ug/kg/min via INTRAVENOUS

## 2015-02-24 NOTE — Progress Notes (Signed)
[name] [name] is a 74 y.o. female  <principal problem not specified>   SUBJECTIVE:  Pt c/o weakness. Wants to go home. Having diarrhea mostly due to colon prep. GI consult noted. Denies CP or SOB. Labs look good today. VSS.  ______________________________________________________________________  ROS: Review of systems is unremarkable for any active cardiac,respiratory, GI, GU, hematologic, neurologic or psychiatric systems, 10 systems reviewed.  @CMEDLIST @  Past Medical History  Diagnosis Date  . Shortness of breath dyspnea   . Dysrhythmia     afib  . Coronary artery disease   . Hypertension   . Heart murmur   . Arthritis   . Depression   . Diabetes mellitus without complication ([place])   . IBS (irritable bowel syndrome)   . Atrial fibrillation [facility])     Past Surgical History  Procedure Laterality Date  . Coronary angioplasty    . Eye surgery    . Joint replacement Right     tkr  . Shoulder arthroscopy with rotator cuff repair    . Cataract extraction w/phaco Right 01/13/2015    Procedure: CATARACT EXTRACTION PHACO AND INTRAOCULAR LENS PLACEMENT (IOC);  Surgeon: Birder Robson, MD;  Location: [facility] ORS;  Service: Ophthalmology;  Laterality: Right;  US:01:15.6 AP:19.6 CDE:14.84 LOT PACK #[id number] H    PHYSICAL EXAM:  BP 126/77 mmHg  Pulse 80  Temp(Src) 97.7 F (36.5 C) (Oral)  Resp 18  Ht 5\' 6"  (1.676 m)  Wt 71.668 kg (158 lb)  BMI 25.51 kg/m2  SpO2 93%  Wt Readings from Last 3 Encounters:  02/21/15 71.668 kg (158 lb)  01/01/15 71.668 kg (158 lb)            Constitutional: NAD Neck: supple, no thyromegaly Respiratory: CTA, no rales or wheezes Cardiovascular: RRR, no murmur, no gallop Abdomen: soft, good BS, nontender Extremities: no edema Neuro: alert and oriented, no focal motor or sensory deficits  ASSESSMENT/PLAN:  Labs and imaging studies were reviewed  No change in care. Colonoscopy today. F/u labs in AM. Will ask PT and CM to evaluate for  D/Cplanning.

## 2015-02-24 NOTE — Plan of Care (Signed)
Problem: Discharge Progression Outcomes Goal: Other Discharge Outcomes/Goals Outcome: Progressing VSS. Mild RLQ pain in am, declined an intervention. Denies pain during the day.Pt had colonoscopy today. No c/o n/v. Tolerates full liquid diet. Up to the bathroom with 1xassist. Tolerates well.

## 2015-02-24 NOTE — Progress Notes (Signed)
PT Cancellation Note  Patient Details Name: [name] [name] MRN: [id number] DOB: 1941-03-20   Cancelled Treatment:    Reason Eval/Treat Not Completed: Patient declined, no reason specified (Pt reports being too fatigued s/p colonoscopy this morning and wanting to sleep instead.)  Will re-attempt PT eval at a later date/time as able.   Raquel Sarna Julia Alkhatib 02/24/2015, 3:42 PM Leitha Bleak, [place]

## 2015-02-24 NOTE — Op Note (Signed)
[facility] Patient Name: [name] [name] Procedure Date: 02/24/2015 9:41 AM MRN: [id number] Account #: [id number] Date of Birth: 1941/03/02 Admit Type: Inpatient Age: 74 Room: [facility] ENDO ROOM 4 Gender: Female Note Status: Finalized Procedure:         Colonoscopy Indications:       Chronic diarrhea Providers:         Lucilla Lame, MD Referring MD:      Leonie Douglas. Doy Hutching, MD (Referring MD) Medicines:         Propofol per Anesthesia Complications:     No immediate complications. Procedure:         Pre-Anesthesia Assessment:                    - Prior to the procedure, a History and Physical was                     performed, and patient medications and allergies were                     reviewed. The patient's tolerance of previous anesthesia                     was also reviewed. The risks and benefits of the procedure                     and the sedation options and risks were discussed with the                     patient. All questions were answered, and informed consent                     was obtained. Prior Anticoagulants: The patient has taken                     no previous anticoagulant or antiplatelet agents. ASA                     Grade Assessment: II - A patient with mild systemic                     disease. After reviewing the risks and benefits, the                     patient was deemed in satisfactory condition to undergo                     the procedure.                    After obtaining informed consent, the colonoscope was                     passed under direct vision. Throughout the procedure, the                     patient's blood pressure, pulse, and oxygen saturations                     were monitored continuously. The Colonoscope was                     introduced through the anus and advanced to the the cecum,  identified by appendiceal orifice and ileocecal valve. The                     colonoscopy  was performed without difficulty. The patient                     tolerated the procedure well. The quality of the bowel                     preparation was excellent. Findings:      The perianal and digital rectal examinations were normal.      The terminal ileum appeared normal. Biopsies were taken with a cold       forceps for histology.      A 4 mm polyp was found in the cecum. The polyp was sessile. The polyp       was removed with a cold snare. Resection and retrieval were complete.      A 4 mm polyp was found in the cecum. The polyp was sessile. The polyp       was removed with a cold biopsy forceps. Resection and retrieval were       complete.      A diffuse area of mildly erythematous mucosa was found in the rectum and       in the sigmoid colon. Biopsies were taken with a cold forceps for       histology.      Non-bleeding internal hemorrhoids were found during retroflexion. The       hemorrhoids were Grade I (internal hemorrhoids that do not prolapse).      Random biopsies were obtained with cold forceps for histology randomly       in the entire colon. Impression:        - The examined portion of the ileum was normal. Biopsied.                    - One 4 mm polyp in the cecum. Resected and retrieved.                    - One 4 mm polyp in the cecum. Resected and retrieved.                    - Erythematous mucosa in the rectum and in the sigmoid                     colon. Biopsied.                    - Non-bleeding internal hemorrhoids.                    - Random biopsies were obtained in the entire colon. Recommendation:    - Await pathology results. Procedure Code(s): --- Professional ---                    [phone], Colonoscopy, flexible; with removal of tumor(s),                     polyp(s), or other lesion(s) by snare technique                    58527, 97, Colonoscopy, flexible; with biopsy, single or                     multiple Diagnosis  Code(s): --- Professional  ---                    K52.9, Noninfective gastroenteritis and colitis,                     unspecified                    D12.0, Benign neoplasm of cecum                    K62.9, Disease of anus and rectum, unspecified                    K63.9, Disease of intestine, unspecified CPT copyright 2014 American Medical Association. All rights reserved. The codes documented in this report are preliminary and upon coder review may  be revised to meet current compliance requirements. Lucilla Lame, MD 02/24/2015 10:12:48 AM This report has been signed electronically. Number of Addenda: 0 Note Initiated On: 02/24/2015 9:41 AM Scope Withdrawal Time: 0 hours 7 minutes 20 seconds  Total Procedure Duration: 0 hours 15 minutes 30 seconds       [facility]

## 2015-02-24 NOTE — Transfer of Care (Signed)
Immediate Anesthesia Transfer of Care Note  Patient: [name] [name]  Procedure(s) Performed: Procedure(s): COLONOSCOPY WITH PROPOFOL (N/A)  Patient Location: PACU  Anesthesia Type:General  Level of Consciousness: sedated  Airway & Oxygen Therapy: Patient Spontanous Breathing and Patient connected to nasal cannula oxygen  Post-op Assessment: Report given to RN and Post -op Vital signs reviewed and stable  Post vital signs: Reviewed and stable  Last Vitals:  Filed Vitals:   02/24/15 1016  BP:   Pulse:   Temp: 36.3 C  Resp:     Complications: No apparent anesthesia complications

## 2015-02-24 NOTE — Plan of Care (Signed)
Problem: Discharge Progression Outcomes Goal: Other Discharge Outcomes/Goals Outcome: Progressing Plan of care progress to goal: VSS Pt continues to drink bowel prep reluctantly.  Reinforcing its importance as she doesn't want to finish it. Pt getting up to [facility] with no problems.  Dizziness has resolved.

## 2015-02-24 NOTE — Progress Notes (Signed)
PHYSICAL THERAPY:  Pt currently out of room, unavailable for therapy evaluation at this time. Will re-attempt, time permitting.  Greggory Stallion, PT, DPT [phone]

## 2015-02-24 NOTE — Anesthesia Preprocedure Evaluation (Addendum)
Anesthesia Evaluation  Patient identified by MRN, date of birth, ID band Patient awake    Reviewed: Allergy & Precautions, H&P , NPO status , Patient's Chart, lab work & pertinent test results  History of Anesthesia Complications (+) PONV and history of anesthetic complications  Airway Mallampati: II  TM Distance: >3 FB     Dental  (+) Upper Dentures, Lower Dentures   Pulmonary shortness of breath and with exertion, neg sleep apnea, neg recent URI, former smoker,    Pulmonary exam normal breath sounds clear to auscultation       Cardiovascular hypertension, (-) angina+ CAD and + Cardiac Stents (2 stents, last one placed in 2002)  (-) Past MI and (-) CABG Normal cardiovascular exam+ dysrhythmias Atrial Fibrillation + Valvular Problems/Murmurs   Heart murmur   Neuro/Psych PSYCHIATRIC DISORDERS (depression) Depression negative neurological ROS     GI/Hepatic Neg liver ROS, GERD  ,IBS   Endo/Other  diabetes, Well Controlled  Renal/GU negative Renal ROS  negative genitourinary   Musculoskeletal  (+) Arthritis , Osteoarthritis,    Abdominal Normal abdominal exam  (+)   Peds negative pediatric ROS (+)  Hematology negative hematology ROS (+)   Anesthesia Other Findings Past Medical History:   Shortness of breath dyspnea                                  Dysrhythmia                                                    Comment:afib   Coronary artery disease                                      Hypertension                                                 Heart murmur                                                 Arthritis                                                    Depression                                                   Diabetes mellitus without complication (HCC)                 IBS (irritable bowel syndrome)  Atrial fibrillation (HCC)                                    Reproductive/Obstetrics negative OB ROS                            Anesthesia Physical  Anesthesia Plan  ASA: III  Anesthesia Plan: General   Post-op Pain Management:    Induction: Intravenous  Airway Management Planned: Nasal Cannula  Additional Equipment:   Intra-op Plan:   Post-operative Plan:   Informed Consent: I have reviewed the patients History and Physical, chart, labs and discussed the procedure including the risks, benefits and alternatives for the proposed anesthesia with the patient or authorized representative who has indicated his/her understanding and acceptance.   Dental advisory given  Plan Discussed with: CRNA, Surgeon and Anesthesiologist  Anesthesia Plan Comments:         Anesthesia Quick Evaluation

## 2015-02-25 LAB — CBC WITH DIFFERENTIAL/PLATELET
Basophils Absolute: 0 10*3/uL (ref 0–0.1)
Basophils Relative: 0 %
Eosinophils Absolute: 0.1 10*3/uL (ref 0–0.7)
Eosinophils Relative: 1 %
HCT: 38.7 % (ref 35.0–47.0)
Hemoglobin: 13 g/dL (ref 12.0–16.0)
LYMPHS ABS: 0.9 10*3/uL — AB (ref 1.0–3.6)
LYMPHS PCT: 12 %
MCH: 30.3 pg (ref 26.0–34.0)
MCHC: 33.6 g/dL (ref 32.0–36.0)
MCV: 90.1 fL (ref 80.0–100.0)
MONO ABS: 0.6 10*3/uL (ref 0.2–0.9)
MONOS PCT: 7 %
NEUTROS ABS: 6.1 10*3/uL (ref 1.4–6.5)
Neutrophils Relative %: 80 %
Platelets: 124 10*3/uL — ABNORMAL LOW (ref 150–440)
RBC: 4.3 MIL/uL (ref 3.80–5.20)
RDW: 16.4 % — AB (ref 11.5–14.5)
WBC: 7.7 10*3/uL (ref 3.6–11.0)

## 2015-02-25 LAB — COMPREHENSIVE METABOLIC PANEL
ALT: 18 U/L (ref 14–54)
ANION GAP: 5 (ref 5–15)
AST: 19 U/L (ref 15–41)
Albumin: 3.2 g/dL — ABNORMAL LOW (ref 3.5–5.0)
Alkaline Phosphatase: 57 U/L (ref 38–126)
BILIRUBIN TOTAL: 1.3 mg/dL — AB (ref 0.3–1.2)
BUN: 9 mg/dL (ref 6–20)
CALCIUM: 8.3 mg/dL — AB (ref 8.9–10.3)
CO2: 20 mmol/L — ABNORMAL LOW (ref 22–32)
Chloride: 114 mmol/L — ABNORMAL HIGH (ref 101–111)
Creatinine, Ser: 0.74 mg/dL (ref 0.44–1.00)
GFR calc Af Amer: >60 mL/min (ref >60)
Glucose, Bld: 102 mg/dL — ABNORMAL HIGH (ref 65–99)
POTASSIUM: 4.7 mmol/L (ref 3.5–5.1)
Sodium: 139 mmol/L (ref 135–145)
TOTAL PROTEIN: 6 g/dL — AB (ref 6.5–8.1)

## 2015-02-25 LAB — SURGICAL PATHOLOGY

## 2015-02-25 LAB — MAGNESIUM: MAGNESIUM: 1.7 mg/dL (ref 1.7–2.4)

## 2015-02-25 MED ORDER — PANTOPRAZOLE SODIUM 40 MG PO TBEC
40.0000 mg | DELAYED_RELEASE_TABLET | Freq: Two times a day (BID) | ORAL | Status: DC
  Administered 2015-02-25: 09:00:00 40 mg via ORAL
  Filled 2015-02-25: qty 1

## 2015-02-25 MED ORDER — POTASSIUM CHLORIDE CRYS ER 20 MEQ PO TBCR: 20.0000 meq | EXTENDED_RELEASE_TABLET | Freq: Two times a day (BID) | ORAL | Status: AC

## 2015-02-25 MED ORDER — PANTOPRAZOLE SODIUM 40 MG PO TBEC: 40.0000 mg | DELAYED_RELEASE_TABLET | Freq: Two times a day (BID) | ORAL | Status: AC

## 2015-02-25 MED ORDER — FUROSEMIDE 20 MG PO TABS: 20.0000 mg | ORAL_TABLET | Freq: Every day | ORAL | Status: AC | PRN

## 2015-02-25 MED ORDER — DICYCLOMINE HCL 20 MG PO TABS: 20.0000 mg | ORAL_TABLET | Freq: Three times a day (TID) | ORAL | Status: AC

## 2015-02-25 NOTE — Care Management (Signed)
Physical therapy evaluation completed. Recommending home with home health and physical therapy. Spoke with with [name] [name] at the bedside. Would like [facility] for [place] and Home oxygen. Merri Ray and Dara Hoyer, representatives for [facility] , updated. Shelbie Ammons RN MSN Care Management [phone]

## 2015-02-25 NOTE — Discharge Instructions (Signed)
2L O2 per [place] with home health

## 2015-02-25 NOTE — Plan of Care (Signed)
Problem: Discharge Progression Outcomes Goal: Other Discharge Outcomes/Goals Outcome: Progressing Plan of care progress to goal 1. Discharge plan: Pt for possible discharge today.  2. Complications resolved: Pt had colonoscopy yesterday; two polyps removed. Pt did c/o some abdominal bloating. 3. Diet: pt tolerating full liquid diet. 4. Activity: pt up with minimal assist

## 2015-02-25 NOTE — Discharge Summary (Addendum)
[name] [name], is a 74 y.o. female  DOB 09-Oct-1940  MRN [id number].  Admission date:  02/21/2015  Admitting Physician  Loletha Grayer, MD  Discharge Date:  02/25/2015   Primary MD  SPARKS,JEFFREY D, MD  Recommendations for primary care physician for things to follow:      Admission Diagnosis  Liver mass [R16.0] Abdominal pain [R10.9] Diarrhea, unspecified type [R19.7]   Discharge Diagnosis  Liver mass [R16.0] Abdominal pain [R10.9] Diarrhea, unspecified type [R19.7]    Active Problems:   Diarrhea   Benign neoplasm of cecum   Colitis   Disease of rectum      Past Medical History  Diagnosis Date  . Shortness of breath dyspnea   . Dysrhythmia     afib  . Coronary artery disease   . Hypertension   . Heart murmur   . Arthritis   . Depression   . Diabetes mellitus without complication ([place])   . IBS (irritable bowel syndrome)   . Atrial fibrillation [facility])     Past Surgical History  Procedure Laterality Date  . Coronary angioplasty    . Eye surgery    . Joint replacement Right     tkr  . Shoulder arthroscopy with rotator cuff repair    . Cataract extraction w/phaco Right 01/13/2015    Procedure: CATARACT EXTRACTION PHACO AND INTRAOCULAR LENS PLACEMENT (IOC);  Surgeon: Birder Robson, MD;  Location: [facility] ORS;  Service: Ophthalmology;  Laterality: Right;  US:01:15.6 AP:19.6 CDE:14.84 LOT PACK #[id number] H  . Colonoscopy with propofol N/A 02/24/2015    Procedure: COLONOSCOPY WITH PROPOFOL;  Surgeon: Lucilla Lame, MD;  Location: [facility] ENDOSCOPY;  Service: Endoscopy;  Laterality: N/A;       History of present illness and  Hospital Course:     Kindly see H&P for history of present illness and admission details, please review complete Labs, Consult reports and Test reports for all details in brief  HPI  from the history and physical done on the day of admission    Hospital Course     Pt admitted with diarrhea and hypokalemia. Electrolytes corrected. GI consult performed. Colonoscopy done. Now on Bentyl with some improvement. Pt wants to go home.   Discharge Condition: stable   Follow UP  Follow-up Information    Follow up with SPARKS,JEFFREY D, MD In 1 week.   Specialty:  Internal Medicine   Contact information:   [place] [id number] [phone]         Discharge Instructions  and  Discharge Medications   See below     Medication List    STOP taking these medications        clidinium-chlordiazePOXIDE 5-2.5 MG capsule  Commonly known as:  LIBRAX      TAKE these medications        diazepam 5 MG tablet  Commonly known as:  VALIUM  Take 5 mg by mouth every 6 (six) hours as needed for anxiety.     dicyclomine 20 MG  tablet  Commonly known as:  BENTYL  Take 1 tablet (20 mg total) by mouth 4 (four) times daily -  before meals and at bedtime.     digoxin 0.125 MG tablet  Commonly known as:  LANOXIN  Take 0.125 mg by mouth daily.     diltiazem 240 MG 24 hr capsule  Commonly known as:  DILACOR XR  Take 240 mg by mouth daily.     donepezil 5 MG tablet  Commonly known as:  ARICEPT  Take 5 mg by mouth at bedtime.     escitalopram 20 MG tablet  Commonly known as:  LEXAPRO  Take 20 mg by mouth daily.     furosemide 20 MG tablet  Commonly known as:  LASIX  Take 1 tablet (20 mg total) by mouth daily as needed.     pantoprazole 40 MG tablet  Commonly known as:  PROTONIX  Take 1 tablet (40 mg total) by mouth 2 (two) times daily.     potassium chloride SA 20 MEQ tablet  Commonly known as:  K-DUR,KLOR-CON  Take 1 tablet (20 mEq total) by mouth 2 (two) times daily.     pravastatin 10 MG tablet  Commonly known as:  PRAVACHOL  Take 10 mg by mouth at bedtime.     rivaroxaban 20 MG Tabs tablet  Commonly known as:  XARELTO  Take 20 mg by mouth daily with supper.     zolpidem 10 MG tablet   Commonly known as:  AMBIEN  Take 10 mg by mouth at bedtime as needed for sleep.          Diet and Activity recommendation: See Discharge Instructions above   Consults obtained - GI, PT, CM   Major procedures and Radiology Reports - PLEASE review detailed and final reports for all details, in brief -   See below   Mr Abdomen W Wo Contrast  02/21/2015   CLINICAL DATA:  CT and ultrasound demonstrating hepatic dome lesion. Abdominal pain for 3 weeks. Severe diarrhea.  EXAM: MRI ABDOMEN WITHOUT AND WITH CONTRAST  TECHNIQUE: Multiplanar multisequence MR imaging of the abdomen was performed both before and after the administration of intravenous contrast.  CONTRAST:  66mL MULTIHANCE GADOBENATE DIMEGLUMINE 529 MG/ML IV SOLN  COMPARISON:  CT and ultrasound of earlier today.  FINDINGS: Portions of exam are mildly motion degraded.  Lower chest: Normal heart size without pericardial or pleural effusion.  Hepatobiliary: Hepatomegaly, 20.9 cm craniocaudal. Irregular hepatic capsule. Corresponding the CT abnormality, within the subcapsular right perihepatic space is a 3.4 x 2.0 x 3.9 cm fluid signal structure without post-contrast enhancement. No suspicious parenchymal liver lesions. Normal gallbladder, without biliary ductal dilatation.  Pancreas: Normal, without mass or ductal dilatation.  Spleen: Normal in size, without focal abnormality.  Adrenals/Urinary Tract: Normal adrenal glands. Normal right kidney. Scarred upper and interpolar left kidney. No hydronephrosis.  Stomach/Bowel: Normal stomach and abdominal bowel loops.  Vascular/Lymphatic: Normal caliber of the aorta and branch vessels. Patent hepatic veins and portal vein. No retroperitoneal or retrocrural adenopathy.  Other: No ascites.  Musculoskeletal: Convex right lumbar spine curvature is mild.  IMPRESSION: 1. The CT abnormality represents a a perihepatic, subcapsular simple fluid collection which could be related to remote trauma or biopsy. No  suspicious parenchymal liver lesion. Hepatomegaly. Suspicion of mild cirrhosis. 2. Otherwise, motion degraded exam.   Electronically Signed   By: Abigail Miyamoto M.D.   On: 02/21/2015 21:52   Ct Abdomen Pelvis W Contrast  02/21/2015  CLINICAL DATA:  Abdominal pain with acute onset diarrhea.  Diabetes  EXAM: CT ABDOMEN AND PELVIS WITH CONTRAST  TECHNIQUE: Multidetector CT imaging of the abdomen and pelvis was performed using the standard protocol following bolus administration of intravenous contrast.  CONTRAST:  123mL OMNIPAQUE IOHEXOL 300 MG/ML  SOLN  COMPARISON:  CT abdomen 02/17/2012 and 08/22/2005  FINDINGS: Lower chest: Lung bases clear. There is significant enlargement of the right atrium measuring 9 x 8.5 cm. This suggests tricuspid valve disease. Mild enlargement of the right ventricle also noted.  Hepatobiliary: The liver capsule is mildly irregular suggesting mild hepatocellular disease, possible cirrhosis. Mild enlargement left lobe of the liver. Hypoechoic mass lesion appears extra hepatic just below the posterior hemidiaphragm. This indents the liver and may be extracapsular in location. The mass is homogeneous and measures 4.0 x 2.2 cm. Review of the study in 2013 reveals a questionable smaller density in this area which appears to have grown significantly. No other liver lesions. Possible small gallstones. Bile ducts nondilated.  Pancreas: Negative  Spleen: Negative  Adrenals/Urinary Tract: Negative for renal mass or obstruction. No adrenal mass. Scar of the left anterior renal cortex unchanged from 2013. No bladder lesion identified.  Stomach/Bowel: Negative for bowel obstruction. No bowel mass or edema.  Vascular/Lymphatic: Atherosclerotic calcification aorta and iliac arteries without aneurysm.  Reproductive: Uterus not enlarged.  No adnexal mass.  Other: No other peritoneal masses. No ascites. Negative for adenopathy.  Musculoskeletal: Mild lumbar degenerative change. No acute bone lesion.   IMPRESSION: Possible cirrhosis of the liver which is mild.  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 This is subtly present in 2013 and has progressed. Differential includes benign and malignant tumor. Peritoneal metastatic disease could cause this appearance but no other lesions are identified and this appears to show slow growth. Further evaluation with MRI without with contrast suggested for further evaluation  Marked enlargement of the right atrium  Possible gallstones  Left renal infarct unchanged.   Electronically Signed   By: Franchot Gallo M.D.   On: 02/21/2015 10:58   Mm Screening Breast Tomo Bilateral  01/28/2015   CLINICAL DATA:  Screening.  EXAM: DIGITAL SCREENING BILATERAL MAMMOGRAM WITH 3D TOMO WITH CAD  COMPARISON:  Previous exam(s).  ACR Breast Density Category b: There are scattered areas of fibroglandular density.  FINDINGS: There are no findings suspicious for malignancy. Images were processed with CAD.  IMPRESSION: No mammographic evidence of malignancy. A result letter of this screening mammogram will be mailed directly to the patient.  RECOMMENDATION: Screening mammogram in one year. (Code:SM-B-01Y)  BI-RADS CATEGORY  1: Negative.   Electronically Signed   By: Lillia Mountain M.D.   On: 01/28/2015 08:35   US Abdomen Limited Ruq  02/21/2015   CLINICAL DATA:  Right upper quadrant pain with diarrhea for 1 day  EXAM: US ABDOMEN LIMITED - RIGHT UPPER QUADRANT  COMPARISON:  CT abdomen and pelvis February 21, 2015  FINDINGS: Gallbladder:  Within the gallbladder, there are multiple small echogenic foci which move and shadow consistent with cholelithiasis none. The largest of these foci measures 9 mm. There is no gallbladder wall thickening or pericholecystic fluid. No sonographic Murphy sign noted.  Common bile duct:  Diameter: 5 mm. No intrahepatic or extrahepatic biliary duct dilatation.  Liver:  There is a hypoechoic mass along the posterior superior aspect of the  right lobe of the liver measuring 3.0 x 2.1 x 2.6 cm corresponding to the lesions seen by  CT. No other focal liver lesions are identified on this study. The liver echogenicity is mildly increased with a subtly nodular contour to the liver suggesting underlying cirrhosis.  IMPRESSION: Cholelithiasis.  No biliary duct dilatation.  Mass along the dome of the liver, hypoechoic but not indicative of a simple cyst, corresponding to the lesions seen earlier in the day on CT. The appearance the liver suggests underlying parenchymal disease. The sensitivity of ultrasound for focal liver lesions is diminished in this circumstance. Correlation with pre and post-contrast liver MR to further evaluate the lesion arising in the liver dome region is advisable.   Electronically Signed   By: Lowella Grip III M.D.   On: 02/21/2015 12:44    Micro Results   See below  Recent Results (from the past 240 hour(s))  C difficile quick scan w PCR reflex     Status: None   Collection Time: 02/21/15 10:53 AM  Result Value Ref Range Status   C Diff antigen NEGATIVE NEGATIVE Final   C Diff toxin NEGATIVE NEGATIVE Final   C Diff interpretation Negative for C. difficile  Final  Stool culture     Status: None   Collection Time: 02/21/15 10:53 AM  Result Value Ref Range Status   Specimen Description STOOL  Final   Special Requests STOOL  Final   Culture   Final    NO SALMONELLA OR SHIGELLA ISOLATED No Pathogenic E. coli detected NO CAMPYLOBACTER DETECTED MODERATE GROWTH PLESIOMONAS SHIGELLOIDES MODERATE GROWTH EDWARDSIELLA TARDA    Report Status 02/24/2015 FINAL  Final       Today   Subjective:   [name] [name] today has no headache,no chest abdominal pain,no new weakness tingling or numbness, feels much better wants to go home today.   Objective:   Blood pressure 123/74, pulse 99, temperature 97.6 F (36.4 C), temperature source Oral, resp. rate 18, height 5\' 6"  (1.676 m), weight 71.668 kg (158 lb), SpO2  92 %.   Intake/Output Summary (Last 24 hours) at 02/25/15 0714 Last data filed at 02/25/15 0553  Gross per 24 hour  Intake   2173 ml  Output      0 ml  Net   2173 ml    Exam Awake Alert, Oriented x 3, No new F.N deficits, Normal affect [place].AT,PERRAL Supple Neck,No JVD, No cervical lymphadenopathy appriciated.  Symmetrical Chest wall movement, Good air movement bilaterally, CTAB Irregularly irregular,No Gallops,Rubs or new Murmurs, No Parasternal Heave +ve B.Sounds, Abd Soft, Non tender, No organomegaly appriciated, No rebound -guarding or rigidity. No Cyanosis, Clubbing or edema, No new Rash or bruise  Data Review   CBC w Diff: Lab Results  Component Value Date   WBC 7.7 02/25/2015   WBC 11.7* 06/05/2013   HGB 13.0 02/25/2015   HGB 11.3* 06/21/2013   HCT 38.7 02/25/2015   HCT 42.5 06/05/2013   PLT 124* 02/25/2015   PLT 158 06/21/2013   LYMPHOPCT 12 02/25/2015   MONOPCT 7 02/25/2015   EOSPCT 1 02/25/2015   BASOPCT 0 02/25/2015    CMP: Lab Results  Component Value Date   NA 140 02/24/2015   NA 136 06/23/2013   K 4.1 02/24/2015   K 3.4* 06/23/2013   CL 111 02/24/2015   CL 98 06/23/2013   CO2 22 02/24/2015   CO2 34* 06/23/2013   BUN 7 02/24/2015   BUN 11 06/23/2013   CREATININE 0.83 02/24/2015   CREATININE 0.80 06/23/2013   PROT 7.4 02/24/2015   ALBUMIN 4.1 02/24/2015  BILITOT 1.3* 02/24/2015   ALKPHOS 68 02/24/2015   AST 29 02/24/2015   ALT 23 02/24/2015  .   Total Time in preparing paper work, data evaluation and todays exam - 66 minutes  SPARKS,JEFFREY D M.D on 02/25/2015 at 7:14 AM        Please note that pt is hypoxic due chronic compensated CHF from A-fib. She medically requires oxygen at 2L per [place] at this time.

## 2015-02-25 NOTE — Progress Notes (Signed)
02/25/15 10:00  83% room air-Resting  10:01  92% 2L oxygen-Resting  10:02 86% room air-Exercise  10:03 92% 2L oxygen-Exercise

## 2015-02-25 NOTE — Evaluation (Signed)
Physical Therapy Evaluation Patient Details Name: NATALLIE RAVENSCROFT MRN: [id number] DOB: 09-18-1940 Today's Date: 02/25/2015   History of Present Illness  Pt is a 74 y.o. female presenting to hospital with diarrhea watery in nature (pt with profound diarrhea and has had 4 months of diarrhea).  Imaging showing liver mass.  Pt s/p colonoscopy 02/24/15.  PMH includes a-fib, htn, HLD, IBS, heart disease, R TKR, R rotator cuff repair.  Clinical Impression  Currently pt demonstrates impairments with balance, general strength, and limitations with functional mobility.  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  Pt lives with her husband in 1 level home with short step to enter.  Currently pt is SBA and steady with ambulation 200 feet with RW (pt unsteady without RW).  Pt's O2 saturations 83% on room air but 92% on 2 L/min via nasal cannula.  After 150 feet ambulating with RW, pt's O2 91-92% (on 2 L/min via nasal cannula) but after 200 feet O2 decreased to 86% and required a few minutes sitting rest break to increase O2 back to 92% on 2 L/min via nasal cannula. Pt would benefit from skilled PT to address above noted impairments and functional limitations.  Recommend pt discharge to home with HHPT, use of RW, and support of family when medically appropriate.  Nursing and CM notified of above and pt's O2 needs.     Follow Up Recommendations Home health PT    Equipment Recommendations  Rolling walker with 5" wheels (pt reports she has one at home that fits her)    Recommendations for Other Services       Precautions / Restrictions Precautions Precautions: Fall Precaution Comments: Monitor O2 Restrictions Weight Bearing Restrictions: No      Mobility  Bed Mobility Overal bed mobility: Needs Assistance Bed Mobility: Supine to Sit;Sit to Supine     Supine to sit: Supervision;HOB  elevated Sit to supine: Supervision;HOB elevated      Transfers Overall transfer level: Needs assistance Equipment used: Rolling walker (2 wheeled) Transfers: Sit to/from [facility] Sit to Stand: Supervision Stand pivot transfers: Min guard (transfer bed to/from commode no AD)       General transfer comment: steady without loss of balance with RW use  Ambulation/Gait Ambulation/Gait assistance: Supervision Ambulation Distance (Feet): 200 Feet Assistive device: Rolling walker (2 wheeled)   Gait velocity: decreased   General Gait Details: decreased B step length/foot clearance/heelstrike; steady with RW use  Stairs            Wheelchair Mobility    Modified Rankin (Stroke Patients Only)       Balance Overall balance assessment: Needs assistance Sitting-balance support: No upper extremity supported;Feet supported Sitting balance-Leahy Scale: Normal     Standing balance support: No upper extremity supported Standing balance-Leahy Scale: Fair Standing balance comment: good balance with B UE support on RW  CGA with pt standing performing toileting hygiene (with single UE support on bed for balance)                           Pertinent Vitals/Pain Pain Assessment: No/denies pain  HR WFL See O2 vitals above (nursing notified and nursing entered O2 vitals into EMR)    Home Living Family/patient expects to be discharged to:: Private residence Living Arrangements: Spouse/significant other Available Help at Discharge: Family (Pt's daughter) Type of Home: Mobile  home Home Access: Stairs to enter Entrance Stairs-Rails: None Entrance Stairs-Number of Steps: 1 short step Home Layout: One level Home Equipment: Walker - 2 wheels;Bedside commode      Prior Function Level of Independence: Independent         Comments: Pt is primary caregiver for chronically ill husband (no physical assist but does assist with meals, laundry, cleaning,  etc).  Pt denies any falls in past 6 months and normally does not use any AD to ambulate with.     Hand Dominance        Extremity/Trunk Assessment   Upper Extremity Assessment: Generalized weakness           Lower Extremity Assessment: Generalized weakness         Communication   Communication: No difficulties  Cognition Arousal/Alertness: Awake/alert Behavior During Therapy: WFL for tasks assessed/performed Overall Cognitive Status: Within Functional Limits for tasks assessed                      General Comments   Nursing cleared pt for participation in physical therapy.  Pt agreeable to PT session.    Exercises        Assessment/Plan    PT Assessment Patient needs continued PT services  PT Diagnosis Generalized weakness;Difficulty walking   PT Problem List Decreased strength;Decreased balance;Decreased mobility  PT Treatment Interventions DME instruction;Gait training;Stair training;Functional mobility training;Therapeutic activities;Therapeutic exercise;Balance training;Patient/family education   PT Goals (Current goals can be found in the Care Plan section) Acute Rehab PT Goals Patient Stated Goal: To go home without oxygen PT Goal Formulation: With patient Time For Goal Achievement: 03/11/15 Potential to Achieve Goals: Fair    Frequency Min 2X/week   Barriers to discharge        Co-evaluation               End of Session Equipment Utilized During Treatment: Gait belt;Oxygen Activity Tolerance: Patient tolerated treatment well Patient left: in bed;with call bell/phone within reach;with bed alarm set Nurse Communication: Mobility status (O2 saturations)    Functional Assessment Tool Used: AM-PAC without stairs Functional Limitation: Mobility: Walking and moving around Mobility: Walking and Moving Around Current Status (H0388): At least 40 percent but less than 60 percent impaired, limited or restricted Mobility: Walking and Moving  Around Goal Status [phone]): At least 1 percent but less than 20 percent impaired, limited or restricted    Time: 0928-1000 PT Time Calculation (min) (ACUTE ONLY): 32 min   Charges:   PT Evaluation $Initial PT Evaluation Tier I: 1 Procedure PT Treatments $Therapeutic Activity: 8-22 mins   PT G Codes:   PT G-Codes **NOT FOR INPATIENT CLASS** Functional Assessment Tool Used: AM-PAC without stairs Functional Limitation: Mobility: Walking and moving around Mobility: Walking and Moving Around Current Status ([id number]): At least 40 percent but less than 60 percent impaired, limited or restricted Mobility: Walking and Moving Around Goal Status [phone]): At least 1 percent but less than 20 percent impaired, limited or restricted    [facility] 02/25/2015, 10:36 AM Leitha Bleak, [place]

## 2015-02-26 LAB — GIARDIA, EIA; OVA/PARASITE: Giardia Ag, Stl: NEGATIVE

## 2015-02-26 LAB — O&P RESULT

## 2015-02-26 NOTE — Anesthesia Postprocedure Evaluation (Signed)
  Anesthesia Post-op Note  Patient: [name] [name]  Procedure(s) Performed: Procedure(s): COLONOSCOPY WITH PROPOFOL (N/A)  Anesthesia type:General  Patient location: PACU  Post pain: Pain level controlled  Post assessment: Post-op Vital signs reviewed, Patient's Cardiovascular Status Stable, Respiratory Function Stable, Patent Airway and No signs of Nausea or vomiting  Post vital signs: Reviewed and stable  Last Vitals:  Filed Vitals:   02/25/15 0831  BP:   Pulse: 67  Temp:   Resp:     Level of consciousness: awake, alert  and patient cooperative  Complications: No apparent anesthesia complications

## 2015-03-01 LAB — WBCS, STOOL: WBCS, STOOL: NONE SEEN

## 2015-03-02 LAB — AFP TUMOR MARKER: AFP-Tumor Marker: 2 ng/mL (ref 0.0–8.3)

## 2015-03-02 LAB — HEPATITIS B SURFACE ANTIBODY, QUANTITATIVE: Hepatitis B-Post: <3.1 m[IU]/mL — ABNORMAL LOW (ref >9.9)

## 2015-03-03 ENCOUNTER — Telehealth: Payer: Self-pay

## 2015-03-03 NOTE — Telephone Encounter (Signed)
Pt scheduled for a follow up appt on Oct 25th in [place].

## 2015-03-03 NOTE — Telephone Encounter (Signed)
-----   Message from Lucilla Lame, MD sent at 03/02/2015  9:06 AM EDT ----- Please have the patient come in for a follow up.

## 2015-03-17 ENCOUNTER — Ambulatory Visit (INDEPENDENT_AMBULATORY_CARE_PROVIDER_SITE_OTHER): Payer: Commercial Managed Care - HMO | Admitting: Gastroenterology

## 2015-03-17 ENCOUNTER — Ambulatory Visit: Payer: Self-pay | Admitting: Gastroenterology

## 2015-03-17 ENCOUNTER — Encounter: Payer: Self-pay | Admitting: Gastroenterology

## 2015-03-17 VITALS — BP 129/77 | HR 90 | Temp 97.9°F | Wt 151.4 lb

## 2015-03-17 DIAGNOSIS — R197 Diarrhea, unspecified: Secondary | ICD-10-CM

## 2015-03-17 NOTE — Progress Notes (Signed)
Primary Care Physician: Idelle Crouch, MD  Primary Gastroenterologist:  Dr. Lucilla Lame  Chief Complaint  Patient presents with  . Follow-up Colonoscopy    HPI: [name] [name] is a 75 y.o. female here For follow-up after being discharged from the hospital.The patient reports that she typically has one bowel movement in the morning that is solid and then they have a loose stool later the day. The patient comes a family member who reports that the patient is under a lot of stress and over thinks most things. The patient had a colonoscopy with some nonspecific inflammation in the rectosigmoid area.  Current Outpatient Prescriptions  Medication Sig Dispense Refill  . budesonide (ENTOCORT EC) 3 MG 24 hr capsule Take by mouth.    . diazepam (VALIUM) 5 MG tablet Take 5 mg by mouth every 6 (six) hours as needed for anxiety.    . dicyclomine (BENTYL) 20 MG tablet Take 1 tablet (20 mg total) by mouth 4 (four) times daily -  before meals and at bedtime. 120 tablet 5  . digoxin (LANOXIN) 0.125 MG tablet Take 0.125 mg by mouth daily.    Marland Kitchen diltiazem (DILACOR XR) 240 MG 24 hr capsule Take 240 mg by mouth daily.    Marland Kitchen donepezil (ARICEPT) 5 MG tablet Take 5 mg by mouth at bedtime.    Marland Kitchen escitalopram (LEXAPRO) 20 MG tablet Take 20 mg by mouth daily.    . furosemide (LASIX) 20 MG tablet Take 1 tablet (20 mg total) by mouth daily as needed. 30 tablet 5  . pantoprazole (PROTONIX) 40 MG tablet Take 1 tablet (40 mg total) by mouth 2 (two) times daily. 60 tablet 5  . potassium chloride SA (K-DUR,KLOR-CON) 20 MEQ tablet Take 1 tablet (20 mEq total) by mouth 2 (two) times daily. 60 tablet 5  . pravastatin (PRAVACHOL) 10 MG tablet Take 10 mg by mouth at bedtime.     . rivaroxaban (XARELTO) 20 MG TABS tablet Take 20 mg by mouth daily with supper.    . zolpidem (AMBIEN) 10 MG tablet Take 10 mg by mouth at bedtime as needed for sleep.     No current facility-administered medications for this visit.     Allergies as of 03/17/2015 - Review Complete 03/17/2015  Allergen Reaction Noted  . Butorphanol Other (See Comments) 03/17/2015  . Ceftriaxone Other (See Comments) 03/17/2015  . Hydrocodone-acetaminophen Nausea Only 03/17/2015  . Levaquin [levofloxacin in d5w] Other (See Comments) 01/01/2015  . Metronidazole Other (See Comments) 03/17/2015  . Oxycodone Other (See Comments) 02/21/2015  . Phenergan [promethazine hcl]  01/01/2015  . Septra [sulfamethoxazole-trimethoprim]  01/01/2015  . Sulfamethoxazole Other (See Comments) 03/17/2015  . Tetracyclines & related  01/01/2015  . Trimethoprim Other (See Comments) 03/17/2015    ROS:  General: Negative for anorexia, weight loss, fever, chills, fatigue, weakness. ENT: Negative for hoarseness, difficulty swallowing , nasal congestion. CV: Negative for chest pain, angina, palpitations, dyspnea on exertion, peripheral edema.  Respiratory: Negative for dyspnea at rest, dyspnea on exertion, cough, sputum, wheezing.  GI: See history of present illness. GU:  Negative for dysuria, hematuria, urinary incontinence, urinary frequency, nocturnal urination.  Endo: Negative for unusual weight change.    Physical Examination:   BP 129/77 mmHg  Pulse 90  Temp(Src) 97.9 F (36.6 C) (Oral)  Wt 151 lb 6.4 oz (68.675 kg)  General: Well-nourished, well-developed in no acute distress.  Eyes: No icterus. Conjunctivae pink. Extremities: No lower extremity edema. No clubbing or deformities. Neuro: Alert and oriented  x 3.  Grossly intact. Skin: Warm and dry, no jaundice.   Psych: Alert and cooperative, normal mood and affect.  Labs:    Imaging Studies: Mr Abdomen W Wo Contrast  02/21/2015  CLINICAL DATA:  CT and ultrasound demonstrating hepatic dome lesion. Abdominal pain for 3 weeks. Severe diarrhea. EXAM: MRI ABDOMEN WITHOUT AND WITH CONTRAST TECHNIQUE: Multiplanar multisequence MR imaging of the abdomen was performed both before and after the  administration of intravenous contrast. CONTRAST:  68mL MULTIHANCE GADOBENATE DIMEGLUMINE 529 MG/ML IV SOLN COMPARISON:  CT and ultrasound of earlier today. FINDINGS: Portions of exam are mildly motion degraded. Lower chest: Normal heart size without pericardial or pleural effusion. Hepatobiliary: Hepatomegaly, 20.9 cm craniocaudal. Irregular hepatic capsule. Corresponding the CT abnormality, within the subcapsular right perihepatic space is a 3.4 x 2.0 x 3.9 cm fluid signal structure without post-contrast enhancement. No suspicious parenchymal liver lesions. Normal gallbladder, without biliary ductal dilatation. Pancreas: Normal, without mass or ductal dilatation. Spleen: Normal in size, without focal abnormality. Adrenals/Urinary Tract: Normal adrenal glands. Normal right kidney. Scarred upper and interpolar left kidney. No hydronephrosis. Stomach/Bowel: Normal stomach and abdominal bowel loops. Vascular/Lymphatic: Normal caliber of the aorta and branch vessels. Patent hepatic veins and portal vein. No retroperitoneal or retrocrural adenopathy. Other: No ascites. Musculoskeletal: Convex right lumbar spine curvature is mild. IMPRESSION: 1. The CT abnormality represents a a perihepatic, subcapsular simple fluid collection which could be related to remote trauma or biopsy. No suspicious parenchymal liver lesion. Hepatomegaly. Suspicion of mild cirrhosis. 2. Otherwise, motion degraded exam. Electronically Signed   By: Abigail Miyamoto M.D.   On: 02/21/2015 21:52   Ct Abdomen Pelvis W Contrast  02/21/2015  CLINICAL DATA:  Abdominal pain with acute onset diarrhea.  Diabetes EXAM: CT ABDOMEN AND PELVIS WITH CONTRAST TECHNIQUE: Multidetector CT imaging of the abdomen and pelvis was performed using the standard protocol following bolus administration of intravenous contrast. CONTRAST:  116mL OMNIPAQUE IOHEXOL 300 MG/ML  SOLN COMPARISON:  CT abdomen 02/17/2012 and 08/22/2005 FINDINGS: Lower chest: Lung bases clear. There is  significant enlargement of the right atrium measuring 9 x 8.5 cm. This suggests tricuspid valve disease. Mild enlargement of the right ventricle also noted. Hepatobiliary: The liver capsule is mildly irregular suggesting mild hepatocellular disease, possible cirrhosis. Mild enlargement left lobe of the liver. Hypoechoic mass lesion appears extra hepatic just below the posterior hemidiaphragm. This indents the liver and may be extracapsular in location. The mass is homogeneous and measures 4.0 x 2.2 cm. Review of the study in 2013 reveals a questionable smaller density in this area which appears to have grown significantly. No other liver lesions. Possible small gallstones. Bile ducts nondilated. Pancreas: Negative Spleen: Negative Adrenals/Urinary Tract: Negative for renal mass or obstruction. No adrenal mass. Scar of the left anterior renal cortex unchanged from 2013. No bladder lesion identified. Stomach/Bowel: Negative for bowel obstruction. No bowel mass or edema. Vascular/Lymphatic: Atherosclerotic calcification aorta and iliac arteries without aneurysm. Reproductive: Uterus not enlarged.  No adnexal mass. Other: No other peritoneal masses. No ascites. Negative for adenopathy. Musculoskeletal: Mild lumbar degenerative change. No acute bone lesion. IMPRESSION: Possible cirrhosis of the liver which is mild. 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 This is subtly present in 2013 and has progressed. Differential includes benign and malignant tumor. Peritoneal metastatic disease could cause this appearance but no other lesions are identified and this appears to show slow growth. Further evaluation with MRI without with contrast suggested for further evaluation Marked  enlargement of the right atrium Possible gallstones Left renal infarct unchanged. Electronically Signed   By: Franchot Gallo M.D.   On: 02/21/2015 10:58   US Abdomen Limited Ruq  02/21/2015  CLINICAL DATA:  Right upper  quadrant pain with diarrhea for 1 day EXAM: US ABDOMEN LIMITED - RIGHT UPPER QUADRANT COMPARISON:  CT abdomen and pelvis February 21, 2015 FINDINGS: Gallbladder: Within the gallbladder, there are multiple small echogenic foci which move and shadow consistent with cholelithiasis none. The largest of these foci measures 9 mm. There is no gallbladder wall thickening or pericholecystic fluid. No sonographic Murphy sign noted. Common bile duct: Diameter: 5 mm. No intrahepatic or extrahepatic biliary duct dilatation. Liver: There is a hypoechoic mass along the posterior superior aspect of the right lobe of the liver measuring 3.0 x 2.1 x 2.6 cm corresponding to the lesions seen by CT. No other focal liver lesions are identified on this study. The liver echogenicity is mildly increased with a subtly nodular contour to the liver suggesting underlying cirrhosis. IMPRESSION: Cholelithiasis.  No biliary duct dilatation. Mass along the dome of the liver, hypoechoic but not indicative of a simple cyst, corresponding to the lesions seen earlier in the day on CT. The appearance the liver suggests underlying parenchymal disease. The sensitivity of ultrasound for focal liver lesions is diminished in this circumstance. Correlation with pre and post-contrast liver MR to further evaluate the lesion arising in the liver dome region is advisable. Electronically Signed   By: Lowella Grip III M.D.   On: 02/21/2015 12:44    Assessment and Plan:   [name] [name] is a 74 y.o. y/o female Who was discharged from the hospital due to chronic diarrhea for four months. The patient dates and her diarrhea is worse when she is under a lot of stress. The patient also had a colonoscopy that showed some nonspecific inflammation in the rectosigmoid area. The patient was started on budesonide by her primary care physician. She will continue this until she finishes this present prescription. She reports that it is too expensive to continue using  the medication. The patient will follow up with Dr. Vira Agar if she has any further problems who has been her gastric neurologist for many years.   Note: This dictation was prepared with Dragon dictation along with smaller phrase technology. Any transcriptional errors that result from this process are unintentional.

## 2015-03-30 ENCOUNTER — Emergency Department
Admission: EM | Admit: 2015-03-30 | Discharge: 2015-03-30 | Disposition: A | Discharge status: Home / Self Care | Payer: Commercial Managed Care - HMO | Attending: Emergency Medicine | Admitting: Emergency Medicine

## 2015-03-30 ENCOUNTER — Encounter: Payer: Self-pay | Admitting: *Deleted

## 2015-03-30 DIAGNOSIS — Z87891 Personal history of nicotine dependence: Secondary | ICD-10-CM | POA: Diagnosis not present

## 2015-03-30 DIAGNOSIS — Z79899 Other long term (current) drug therapy: Secondary | ICD-10-CM | POA: Insufficient documentation

## 2015-03-30 DIAGNOSIS — F1310 Sedative, hypnotic or anxiolytic abuse, uncomplicated: Secondary | ICD-10-CM | POA: Insufficient documentation

## 2015-03-30 DIAGNOSIS — E119 Type 2 diabetes mellitus without complications: Secondary | ICD-10-CM | POA: Insufficient documentation

## 2015-03-30 DIAGNOSIS — I10 Essential (primary) hypertension: Secondary | ICD-10-CM | POA: Insufficient documentation

## 2015-03-30 DIAGNOSIS — F32A Depression, unspecified: Secondary | ICD-10-CM

## 2015-03-30 DIAGNOSIS — E860 Dehydration: Secondary | ICD-10-CM | POA: Diagnosis not present

## 2015-03-30 DIAGNOSIS — F329 Major depressive disorder, single episode, unspecified: Secondary | ICD-10-CM | POA: Diagnosis present

## 2015-03-30 LAB — CBC WITH DIFFERENTIAL/PLATELET
Basophils Absolute: 0.1 10*3/uL (ref 0–0.1)
Basophils Relative: 1 %
Eosinophils Absolute: 0.2 10*3/uL (ref 0–0.7)
Eosinophils Relative: 2 %
HEMATOCRIT: 47.8 % — AB (ref 35.0–47.0)
Hemoglobin: 16 g/dL (ref 12.0–16.0)
LYMPHS ABS: 0.9 10*3/uL — AB (ref 1.0–3.6)
LYMPHS PCT: 8 %
MCH: 30.2 pg (ref 26.0–34.0)
MCHC: 33.4 g/dL (ref 32.0–36.0)
MCV: 90.4 fL (ref 80.0–100.0)
MONO ABS: 0.7 10*3/uL (ref 0.2–0.9)
MONOS PCT: 7 %
NEUTROS PCT: 82 %
Neutro Abs: 8.7 10*3/uL — ABNORMAL HIGH (ref 1.4–6.5)
Platelets: 154 10*3/uL (ref 150–440)
RBC: 5.29 MIL/uL — ABNORMAL HIGH (ref 3.80–5.20)
RDW: 15.6 % — AB (ref 11.5–14.5)
WBC: 10.5 10*3/uL (ref 3.6–11.0)

## 2015-03-30 LAB — URINE DRUG SCREEN, QUALITATIVE (ARMC ONLY)
Amphetamines, Ur Screen: NOT DETECTED
BARBITURATES, UR SCREEN: NOT DETECTED
BENZODIAZEPINE, UR SCRN: POSITIVE — AB
COCAINE METABOLITE, UR ~~LOC~~: NOT DETECTED
Cannabinoid 50 Ng, Ur ~~LOC~~: NOT DETECTED
MDMA (Ecstasy)Ur Screen: NOT DETECTED
Methadone Scn, Ur: NOT DETECTED
OPIATE, UR SCREEN: NOT DETECTED
PHENCYCLIDINE (PCP) UR S: NOT DETECTED
Tricyclic, Ur Screen: NOT DETECTED

## 2015-03-30 LAB — COMPREHENSIVE METABOLIC PANEL
ALBUMIN: 4.5 g/dL (ref 3.5–5.0)
ALK PHOS: 84 U/L (ref 38–126)
ALT: 24 U/L (ref 14–54)
ANION GAP: 8 (ref 5–15)
AST: 25 U/L (ref 15–41)
BUN: 12 mg/dL (ref 6–20)
CALCIUM: 9.7 mg/dL (ref 8.9–10.3)
CHLORIDE: 104 mmol/L (ref 101–111)
CO2: 29 mmol/L (ref 22–32)
Creatinine, Ser: 0.71 mg/dL (ref 0.44–1.00)
GFR calc Af Amer: >60 mL/min (ref >60)
GFR calc non Af Amer: >60 mL/min (ref >60)
GLUCOSE: 107 mg/dL — AB (ref 65–99)
Potassium: 4.2 mmol/L (ref 3.5–5.1)
SODIUM: 141 mmol/L (ref 135–145)
Total Bilirubin: 2.3 mg/dL — ABNORMAL HIGH (ref 0.3–1.2)
Total Protein: 7.8 g/dL (ref 6.5–8.1)

## 2015-03-30 LAB — URINALYSIS COMPLETE WITH MICROSCOPIC (ARMC ONLY)
BACTERIA UA: NONE SEEN
Bilirubin Urine: NEGATIVE
GLUCOSE, UA: NEGATIVE mg/dL
Ketones, ur: NEGATIVE mg/dL
Leukocytes, UA: NEGATIVE
Nitrite: NEGATIVE
PROTEIN: 30 mg/dL — AB
SPECIFIC GRAVITY, URINE: 1.005 (ref 1.005–1.030)
pH: 7 (ref 5.0–8.0)

## 2015-03-30 LAB — HEPATITIS B CORE ANTIBODY, TOTAL

## 2015-03-30 LAB — ACETAMINOPHEN LEVEL

## 2015-03-30 LAB — HEPATITIS C ANTIBODY

## 2015-03-30 LAB — ETHANOL: Alcohol, Ethyl (B): <5 mg/dL (ref <5)

## 2015-03-30 LAB — SALICYLATE LEVEL

## 2015-03-30 LAB — HEPATITIS B SURFACE ANTIGEN

## 2015-03-30 MED ORDER — DILTIAZEM HCL ER 240 MG PO CP24: 240.0000 mg | ORAL_CAPSULE | Freq: Every day | ORAL | Status: DC

## 2015-03-30 MED ORDER — SODIUM CHLORIDE 0.9 % IV BOLUS (SEPSIS)
1000.0000 mL | Freq: Once | INTRAVENOUS | Status: AC
  Administered 2015-03-30: 1000 mL via INTRAVENOUS

## 2015-03-30 MED ORDER — DIGOXIN 125 MCG PO TABS
0.1250 mg | ORAL_TABLET | Freq: Every day | ORAL | Status: DC
  Administered 2015-03-30: 0.125 mg via ORAL
  Filled 2015-03-30: qty 1

## 2015-03-30 MED ORDER — RIVAROXABAN 20 MG PO TABS
20.0000 mg | ORAL_TABLET | Freq: Every day | ORAL | Status: DC
  Administered 2015-03-30: 20 mg via ORAL
  Filled 2015-03-30: qty 1

## 2015-03-30 MED ORDER — ESCITALOPRAM OXALATE 10 MG PO TABS
20.0000 mg | ORAL_TABLET | Freq: Every day | ORAL | Status: DC
  Administered 2015-03-30: 20 mg via ORAL
  Filled 2015-03-30: qty 2

## 2015-03-30 MED ORDER — PANTOPRAZOLE SODIUM 40 MG PO TBEC
40.0000 mg | DELAYED_RELEASE_TABLET | Freq: Two times a day (BID) | ORAL | Status: DC
  Administered 2015-03-30: 40 mg via ORAL
  Filled 2015-03-30: qty 1

## 2015-03-30 NOTE — ED Provider Notes (Signed)
Patient was seen and admitted by Dr. Joni Fears and was diagnosed with depression. The patient says that she feels improved at this time in regards to her dehydration symptoms. She is continuing to deny any homicidal or suicidal ideation. Denies any hallucination. Says that she has a follow-up appointment this Wednesday with Dr. Doy Hutching, her primary care doctor.  Patient seen and evaluated by Dr. Joni Fears and the patient Physical Exam  BP 142/82 mmHg  Pulse 43  Temp(Src) 98.2 F (36.8 C) (Oral)  Resp 13  Ht 5\' 6"  (1.676 m)  Wt 147 lb (66.679 kg)  BMI 23.74 kg/m2  SpO2 95%  Physical Exam Resting comfortably at this time.  ED Course  Procedures  MDM Will be discharged to home. To follow-up with her primary care doctor.     Orbie Pyo, MD 03/30/15 [phone]

## 2015-03-30 NOTE — ED Notes (Signed)
[phone] [name] [name] daughter

## 2015-03-30 NOTE — BH Assessment (Signed)
Assessment Note  [name] [name] is an 74 y.o. female. who presents voluntarily to [facility] ED for depression.  Pt reports feelings of sadness and increased anxiety since her recent health issues of colitis in the beginning of October.  Pt states she was in the hospital in October and has increasing felt depressed. Multiple times during the assessment, the Pt stated, "I shouldn't have come here. I was just worried, I should have went to my doctor." The patient sees her primary care physician for her anxiety.  The patient feels she needs a medicine adjustment. The patient states the anxiety started at the beginning of this year, but could not recall any circumstances that might had increased her anxiety. She reports recent symptoms including  fatigue, loss of interest ("sometimes I just want to lay in bed"), decreased sleep, decreased appetite and frequent feelings of frustration. Pt denies any suicidal thoughts.  Pt denies homicidal ideation or history of violence.   Pt denies any current substance use and/or abuse.    Pt identifies several stressors. Pt states her recent health issues and her having to take care of her husband, who also has health issues, have been very stressful for her. She identifies her husband and daughter as being family or friends that are supportive.   Pt lives with her husband and is able to return to her current living situation. Pt denies any physical, verbal, sexual abuse currently or as a child. Pt denies legal problems.  Pt is dressed in hospital scrubs, alert, oriented x4 with normal speech and normal motor behavior. Eye contact is good. Pt's mood is slightly depressed and anxious and affect is congruent with mood. Thought process is coherent and relevant. Cognitive functioning and fund of knowledge is intact and age appropriate. There are no signs of hallucinations, delusions, bizarre behaviors, or other indicators of psychotic process. Pt was pleasant and cooperative  throughout assessment, however continuously stressed her desire to go home.   Diagnosis: Anxiety  Past Medical History:  Past Medical History  Diagnosis Date  . Shortness of breath dyspnea   . Dysrhythmia     afib  . Coronary artery disease   . Hypertension   . Heart murmur   . Arthritis   . Depression   . Diabetes mellitus without complication ([place])   . IBS (irritable bowel syndrome)   . Atrial fibrillation [facility])     Past Surgical History  Procedure Laterality Date  . Coronary angioplasty    . Eye surgery    . Joint replacement Right     tkr  . Shoulder arthroscopy with rotator cuff repair    . Cataract extraction w/phaco Right 01/13/2015    Procedure: CATARACT EXTRACTION PHACO AND INTRAOCULAR LENS PLACEMENT (IOC);  Surgeon: Birder Robson, MD;  Location: [facility] ORS;  Service: Ophthalmology;  Laterality: Right;  US:01:15.6 AP:19.6 CDE:14.84 LOT PACK #[id number] H  . Colonoscopy with propofol N/A 02/24/2015    Procedure: COLONOSCOPY WITH PROPOFOL;  Surgeon: Lucilla Lame, MD;  Location: [facility] ENDOSCOPY;  Service: Endoscopy;  Laterality: N/A;    Family History:  Family History  Problem Relation Age of Onset  . Alzheimer's disease Mother   . Hypertension Mother   . Stroke Mother   . Alzheimer's disease Father   . Hypertension Father   . Stroke Father   . Diabetes Father     Social History:  reports that she has quit smoking. She has never used smokeless tobacco. She reports that she does not drink alcohol or  use illicit drugs.  Additional Social History:  Alcohol / Drug Use Pain Medications: None reported Prescriptions: None reported Over the Counter: None reported History of alcohol / drug use?: No history of alcohol / drug abuse  CIWA: CIWA-Ar BP: (!) 142/82 mmHg Pulse Rate: (!) 43 COWS:    Allergies:  Allergies  Allergen Reactions  . Butorphanol Other (See Comments)  . Ceftriaxone Other (See Comments)  . Hydrocodone-Acetaminophen Nausea Only  . Levaquin  [Levofloxacin In D5w] Other (See Comments)    Muscle pain.  . Metronidazole Other (See Comments)    GI upset, difficulty breathing  . Oxycodone Other (See Comments)    Altered mental status  . Phenergan [Promethazine Hcl]   . Septra [Sulfamethoxazole-Trimethoprim]   . Sulfamethoxazole Other (See Comments)  . Tetracyclines & Related   . Trimethoprim Other (See Comments)    Home Medications:  (Not in a hospital admission)  OB/GYN Status:  No LMP recorded. Patient is postmenopausal.  General Assessment Data Location of Assessment: [facility] ED TTS Assessment: In system Is this a Tele or Face-to-Face Assessment?: Face-to-Face Is this an Initial Assessment or a Re-assessment for this encounter?: Initial Assessment Marital status: Married [place] name: Unknown Is patient pregnant?: No Pregnancy Status: No Living Arrangements: Spouse/significant other Can pt return to current living arrangement?: Yes Admission Status: Voluntary Is patient capable of signing voluntary admission?: Yes Referral Source: Self/Family/Friend Insurance type: Medicare     Crisis Care Plan Living Arrangements: Spouse/significant other Name of Psychiatrist: None Name of Therapist: none  Education Status Is patient currently in school?: No Current Grade: None Highest grade of school patient has completed: 12 Name of school: N/a  Risk to self with the past 6 months Suicidal Ideation: No Has patient been a risk to self within the past 6 months prior to admission? : No Suicidal Intent: No Has patient had any suicidal intent within the past 6 months prior to admission? : No Is patient at risk for suicide?: No Suicidal Plan?: No Has patient had any suicidal plan within the past 6 months prior to admission? : No Access to Means: No What has been your use of drugs/alcohol within the last 12 months?: None reported Previous Attempts/Gestures: No How many times?: 0 Other Self Harm Risks: None noted Triggers  for Past Attempts: None known Intentional Self Injurious Behavior: None Family Suicide History: No Recent stressful life event(s): Other (Comment) (Health issues in early October) Persecutory voices/beliefs?: No Depression: Yes Depression Symptoms: Fatigue, Loss of interest in usual pleasures, Insomnia Substance abuse history and/or treatment for substance abuse?: No Suicide prevention information given to non-admitted patients: Not applicable  Risk to Others within the past 6 months Homicidal Ideation: No Does patient have any lifetime risk of violence toward others beyond the six months prior to admission? : No Thoughts of Harm to Others: No Current Homicidal Intent: No Current Homicidal Plan: No Access to Homicidal Means: No Identified Victim: None reported History of harm to others?: No Assessment of Violence: None Noted Violent Behavior Description: None reported Does patient have access to weapons?: No Criminal Charges Pending?: No Does patient have a court date: No Is patient on probation?: No  Psychosis Hallucinations: None noted Delusions: None noted  Mental Status Report Appearance/Hygiene: Unremarkable Eye Contact: Good Motor Activity: Unremarkable Speech: Logical/coherent, Soft Level of Consciousness: Quiet/awake Mood: Depressed, Anxious Affect: Depressed, Anxious Anxiety Level: Moderate Thought Processes: Coherent, Relevant Judgement: Unimpaired Orientation: Person, Place, Situation, Time, Appropriate for developmental age Obsessive Compulsive Thoughts/Behaviors: None  Cognitive Functioning  Concentration: Normal Memory: Recent Intact, Remote Intact IQ: Average Insight: Good Impulse Control: Good Appetite: Poor Weight Loss: 13 Weight Gain: 0 Sleep: Decreased Total Hours of Sleep: 3 Vegetative Symptoms: None  ADLScreening [facility] Assessment Services) Patient's cognitive ability adequate to safely complete daily activities?: Yes Patient able to  express need for assistance with ADLs?: Yes Independently performs ADLs?: Yes (appropriate for developmental age)  Prior Inpatient Therapy Prior Inpatient Therapy: No  Prior Outpatient Therapy Prior Outpatient Therapy: No Does patient have an ACCT team?: No Does patient have Intensive In-House Services?  : No Does patient have [facility] services? : No Does patient have P4CC services?: No  ADL Screening (condition at time of admission) Patient's cognitive ability adequate to safely complete daily activities?: Yes Patient able to express need for assistance with ADLs?: Yes Independently performs ADLs?: Yes (appropriate for developmental age)       Abuse/Neglect Assessment (Assessment to be complete while patient is alone) Physical Abuse: Denies Verbal Abuse: Denies Sexual Abuse: Denies Exploitation of patient/patient's resources: Denies Self-Neglect: Denies Values / Beliefs Cultural Requests During Hospitalization: None Spiritual Requests During Hospitalization: None   Advance Directives (For Healthcare) Does patient have an advance directive?: Yes Type of Advance Directive: Healthcare Power of Attorney Copy of advanced directive(s) in chart?: No - copy requested    Additional Information 1:1 In Past 12 Months?: No CIRT Risk: No Elopement Risk: No     Disposition:  Disposition Initial Assessment Completed for this Encounter: Yes Disposition of Patient: Other dispositions Other disposition(s): Other (Comment) (Psych Consult) Follow up with PCP.   Kara Mead Shanicqua Coldren, LPCA 03/30/2015 1:01 PM

## 2015-03-30 NOTE — ED Notes (Signed)
Pt O2 sat sustaining at 88%; pt placed on 2L nasal cannula.  O2 sats 97% at this time.

## 2015-03-30 NOTE — ED Notes (Signed)
Pt reports feeling sad, depressed. Pt denies thoughts of hurting self at this time.

## 2015-03-30 NOTE — ED Notes (Signed)
Pt arrives via EMS from home, pt was seen in ER last month for colitis and discharged on valium and states since then she has felt "blah", states lack of sleep and appetitie, states she has not felt like herself, denies current SI but states hx of SI in the past, MD at bedside

## 2015-03-30 NOTE — ED Provider Notes (Signed)
[facility] Emergency Department Provider Note  ____________________________________________  Time seen: 10:15 AM   I have reviewed the triage vital signs and the nursing notes.   HISTORY  Chief Complaint Depression and Dehydration    HPI LAKYIA BEHE is a 74 y.o. female who complains of feeling very low energy, anhedonic, no sleep, poor appetite and poor oral intake. She feels like she does not enjoy anything in his feeling very hopeless about the future. Denies any suicidal ideation or homicidal ideation or hallucinations. She has a history of depression and takes Lexapro 20 mg daily which has been the same dose for years. She was also hospitalized about a month ago and started on Valium at that time. She feels her symptoms have gotten worse since starting the Valium.     Past Medical History  Diagnosis Date  . Shortness of breath dyspnea   . Dysrhythmia     afib  . Coronary artery disease   . Hypertension   . Heart murmur   . Arthritis   . Depression   . Diabetes mellitus without complication ([place])   . IBS (irritable bowel syndrome)   . Atrial fibrillation [facility])      Patient Active Problem List   Diagnosis Date Noted  . Benign neoplasm of cecum   . Colitis   . Disease of rectum   . Diarrhea 02/21/2015  . H/O: depression 09/01/2014  . Type 2 diabetes mellitus ([place]) 03/19/2014  . Atrial fibrillation ([place]) 02/18/2014  . Arteriosclerosis of coronary artery 02/18/2014  . HLD (hyperlipidemia) 02/18/2014  . BP (high blood pressure) 02/18/2014  . Lung mass 01/23/2014     Past Surgical History  Procedure Laterality Date  . Coronary angioplasty    . Eye surgery    . Joint replacement Right     tkr  . Shoulder arthroscopy with rotator cuff repair    . Cataract extraction w/phaco Right 01/13/2015    Procedure: CATARACT EXTRACTION PHACO AND INTRAOCULAR LENS PLACEMENT (IOC);  Surgeon: Birder Robson, MD;  Location: [facility] ORS;  Service:  Ophthalmology;  Laterality: Right;  US:01:15.6 AP:19.6 CDE:14.84 LOT PACK #[id number] H  . Colonoscopy with propofol N/A 02/24/2015    Procedure: COLONOSCOPY WITH PROPOFOL;  Surgeon: Lucilla Lame, MD;  Location: [facility] ENDOSCOPY;  Service: Endoscopy;  Laterality: N/A;     Current Outpatient Rx  Name  Route  Sig  Dispense  Refill  . budesonide (ENTOCORT EC) 3 MG 24 hr capsule   Oral   Take 6 mg by mouth daily.          . diazepam (VALIUM) 5 MG tablet   Oral   Take 5 mg by mouth every 6 (six) hours as needed for anxiety.         . dicyclomine (BENTYL) 20 MG tablet   Oral   Take 1 tablet (20 mg total) by mouth 4 (four) times daily -  before meals and at bedtime.   120 tablet   5   . digoxin (LANOXIN) 0.125 MG tablet   Oral   Take 0.125 mg by mouth daily.         Marland Kitchen diltiazem (DILACOR XR) 240 MG 24 hr capsule   Oral   Take 240 mg by mouth daily.         Marland Kitchen donepezil (ARICEPT) 5 MG tablet   Oral   Take 5 mg by mouth at bedtime.         Marland Kitchen escitalopram (LEXAPRO) 20 MG tablet  Oral   Take 20 mg by mouth daily.         . furosemide (LASIX) 20 MG tablet   Oral   Take 1 tablet (20 mg total) by mouth daily as needed.   30 tablet   5   . mirtazapine (REMERON) 15 MG tablet   Oral   Take 15 mg by mouth at bedtime.         . pantoprazole (PROTONIX) 40 MG tablet   Oral   Take 1 tablet (40 mg total) by mouth 2 (two) times daily.   60 tablet   5   . potassium chloride SA (K-DUR,KLOR-CON) 20 MEQ tablet   Oral   Take 1 tablet (20 mEq total) by mouth 2 (two) times daily.   60 tablet   5   . pravastatin (PRAVACHOL) 10 MG tablet   Oral   Take 10 mg by mouth at bedtime.          . rivaroxaban (XARELTO) 20 MG TABS tablet   Oral   Take 20 mg by mouth daily with supper.         . zolpidem (AMBIEN) 10 MG tablet   Oral   Take 10 mg by mouth at bedtime as needed for sleep.            Allergies Butorphanol; Ceftriaxone; Hydrocodone-acetaminophen; Levaquin;  Metronidazole; Oxycodone; Phenergan; Septra; Sulfamethoxazole; Tetracyclines & related; and Trimethoprim   Family History  Problem Relation Age of Onset  . Alzheimer's disease Mother   . Hypertension Mother   . Stroke Mother   . Alzheimer's disease Father   . Hypertension Father   . Stroke Father   . Diabetes Father     Social History Social History  Substance Use Topics  . Smoking status: Former Research scientist (life sciences)  . Smokeless tobacco: Never Used  . Alcohol Use: No    Review of Systems  Constitutional:   No fever or chills. No weight changes Eyes:   No blurry vision or double vision.  ENT:   No sore throat. Cardiovascular:   No chest pain. Respiratory:   No dyspnea or cough. Gastrointestinal:   Negative for abdominal pain, vomiting and diarrhea.  No BRBPR or melena. Genitourinary:   Negative for dysuria, urinary retention, bloody urine, or difficulty urinating. Musculoskeletal:   Negative for back pain. No joint swelling or pain. Skin:   Negative for rash. Neurological:   Negative for headaches, focal weakness or numbness. Psychiatric:  Positive depression.   Endocrine:  No hot/cold intolerance, changes in energy, or sleep difficulty.  10-point ROS otherwise negative.  ____________________________________________   PHYSICAL EXAM:  VITAL SIGNS: ED Triage Vitals  Enc Vitals Group     BP 03/30/15 1022 151/83 mmHg     Pulse Rate 03/30/15 1022 69     Resp 03/30/15 1022 16     Temp 03/30/15 1022 98.2 F (36.8 C)     Temp Source 03/30/15 1022 Oral     SpO2 03/30/15 1022 95 %     Weight 03/30/15 1022 147 lb (66.679 kg)     Height 03/30/15 1022 5\' 6"  (1.676 m)     Head Cir --      Peak Flow --      Pain Score 03/30/15 1023 4     Pain Loc --      Pain Edu? --      Excl. in [place]? --      Constitutional:   Alert and oriented. Well appearing and in  no distress. Eyes:   No scleral icterus. No conjunctival pallor. PERRL. EOMI ENT   Head:   Normocephalic and  atraumatic.   Nose:   No congestion/rhinnorhea. No septal hematoma   Mouth/Throat:   MMM, no pharyngeal erythema. No peritonsillar mass. No uvula shift.   Neck:   No stridor. No SubQ emphysema. No meningismus. Hematological/Lymphatic/Immunilogical:   No cervical lymphadenopathy. Cardiovascular:   RRR. Normal and symmetric distal pulses are present in all extremities. No murmurs, rubs, or gallops. Respiratory:   Normal respiratory effort without tachypnea nor retractions. Breath sounds are clear and equal bilaterally. No wheezes/rales/rhonchi. Gastrointestinal:   Soft and nontender. No distention. There is no CVA tenderness.  No rebound, rigidity, or guarding. Genitourinary:   deferred Musculoskeletal:   Nontender with normal range of motion in all extremities. No joint effusions.  No lower extremity tenderness.  No edema. Neurologic:   Normal speech and language.  CN 2-10 normal. Motor grossly intact. No pronator drift.  Normal gait. No gross focal neurologic deficits are appreciated.  Skin:    Skin is warm, dry and intact. No rash noted.  No petechiae, purpura, or bullae. Psychiatric:   Depressed mood and affect. Speech and behavior are normal. Patient exhibits appropriate insight and judgment.  ____________________________________________    LABS (pertinent positives/negatives) (all labs ordered are listed, but only abnormal results are displayed) Labs Reviewed  COMPREHENSIVE METABOLIC PANEL - Abnormal; Notable for the following:    Glucose, Bld 107 (*)    Total Bilirubin 2.3 (*)    All other components within normal limits  CBC WITH DIFFERENTIAL/PLATELET - Abnormal; Notable for the following:    RBC 5.29 (*)    HCT 47.8 (*)    RDW 15.6 (*)    Neutro Abs 8.7 (*)    Lymphs Abs 0.9 (*)    All other components within normal limits  URINE DRUG SCREEN, QUALITATIVE ([facility] ONLY) - Abnormal; Notable for the following:    Benzodiazepine, Ur Scrn POSITIVE (*)    All other  components within normal limits  URINALYSIS COMPLETEWITH MICROSCOPIC ([facility] ONLY) - Abnormal; Notable for the following:    Color, Urine STRAW (*)    APPearance CLEAR (*)    Hgb urine dipstick 1+ (*)    Protein, ur 30 (*)    Squamous Epithelial / LPF 0-5 (*)    All other components within normal limits  ACETAMINOPHEN LEVEL - Abnormal; Notable for the following:    Acetaminophen (Tylenol), Serum <10 (*)    All other components within normal limits  ETHANOL  SALICYLATE LEVEL   ____________________________________________   EKG  Interpreted by me Atrial fibrillation rate of 62, normal axis and intervals, normal QRS and ST segments and T waves  ____________________________________________    RADIOLOGY    ____________________________________________   PROCEDURES   ____________________________________________   INITIAL IMPRESSION / ASSESSMENT AND PLAN / ED COURSE  Pertinent labs & imaging results that were available during my care of the patient were reviewed by me and considered in my medical decision making (see chart for details).  Patient presents with symptoms of depression. No significant safety risk for imminent self-harm or harm to others. Sounds like this may have worsened with Valium that was started recently. The patient is amenable to waiting in the emergency department for psychiatric evaluation for further counseling on pharmacotherapy, she may be better served by discontinuing the Valium and increasing her Lexapro. She is medically stable at this time.     ____________________________________________   FINAL  CLINICAL IMPRESSION(S) / ED DIAGNOSES  Final diagnoses:  Depression      Carrie Mew, MD 03/30/15 1408

## 2015-03-30 NOTE — ED Notes (Signed)
Per Dr. Joni Fears pt to not be dressed out at this time

## 2015-04-02 ENCOUNTER — Emergency Department: Payer: Commercial Managed Care - HMO

## 2015-04-02 ENCOUNTER — Emergency Department
Admission: EM | Admit: 2015-04-02 | Discharge: 2015-04-04 | Disposition: A | Discharge status: Other Acute Inpatient Hospital | Payer: Commercial Managed Care - HMO | Attending: Emergency Medicine | Admitting: Emergency Medicine

## 2015-04-02 ENCOUNTER — Encounter: Payer: Self-pay | Admitting: Emergency Medicine

## 2015-04-02 DIAGNOSIS — Z87891 Personal history of nicotine dependence: Secondary | ICD-10-CM | POA: Insufficient documentation

## 2015-04-02 DIAGNOSIS — F329 Major depressive disorder, single episode, unspecified: Secondary | ICD-10-CM | POA: Diagnosis not present

## 2015-04-02 DIAGNOSIS — Z79899 Other long term (current) drug therapy: Secondary | ICD-10-CM | POA: Diagnosis not present

## 2015-04-02 DIAGNOSIS — F32A Depression, unspecified: Secondary | ICD-10-CM

## 2015-04-02 DIAGNOSIS — IMO0001 Reserved for inherently not codable concepts without codable children: Secondary | ICD-10-CM

## 2015-04-02 DIAGNOSIS — Z7901 Long term (current) use of anticoagulants: Secondary | ICD-10-CM | POA: Insufficient documentation

## 2015-04-02 DIAGNOSIS — F99 Mental disorder, not otherwise specified: Secondary | ICD-10-CM | POA: Diagnosis present

## 2015-04-02 DIAGNOSIS — F322 Major depressive disorder, single episode, severe without psychotic features: Secondary | ICD-10-CM | POA: Diagnosis not present

## 2015-04-02 DIAGNOSIS — I10 Essential (primary) hypertension: Secondary | ICD-10-CM | POA: Insufficient documentation

## 2015-04-02 DIAGNOSIS — M549 Dorsalgia, unspecified: Secondary | ICD-10-CM | POA: Insufficient documentation

## 2015-04-02 DIAGNOSIS — F1310 Sedative, hypnotic or anxiolytic abuse, uncomplicated: Secondary | ICD-10-CM | POA: Diagnosis not present

## 2015-04-02 DIAGNOSIS — F419 Anxiety disorder, unspecified: Secondary | ICD-10-CM

## 2015-04-02 DIAGNOSIS — E119 Type 2 diabetes mellitus without complications: Secondary | ICD-10-CM | POA: Insufficient documentation

## 2015-04-02 LAB — URINE DRUG SCREEN, QUALITATIVE (ARMC ONLY)
AMPHETAMINES, UR SCREEN: NOT DETECTED
Barbiturates, Ur Screen: NOT DETECTED
Benzodiazepine, Ur Scrn: POSITIVE — AB
CANNABINOID 50 NG, UR ~~LOC~~: NOT DETECTED
Cocaine Metabolite,Ur ~~LOC~~: NOT DETECTED
MDMA (ECSTASY) UR SCREEN: NOT DETECTED
METHADONE SCREEN, URINE: NOT DETECTED
Opiate, Ur Screen: NOT DETECTED
Phencyclidine (PCP) Ur S: NOT DETECTED
TRICYCLIC, UR SCREEN: NOT DETECTED

## 2015-04-02 LAB — COMPREHENSIVE METABOLIC PANEL
ALBUMIN: 4.5 g/dL (ref 3.5–5.0)
ALT: 20 U/L (ref 14–54)
AST: 23 U/L (ref 15–41)
Alkaline Phosphatase: 86 U/L (ref 38–126)
Anion gap: 6 (ref 5–15)
BUN: 18 mg/dL (ref 6–20)
CHLORIDE: 105 mmol/L (ref 101–111)
CO2: 25 mmol/L (ref 22–32)
Calcium: 9.5 mg/dL (ref 8.9–10.3)
Creatinine, Ser: 0.91 mg/dL (ref 0.44–1.00)
GFR calc Af Amer: >60 mL/min (ref >60)
Glucose, Bld: 105 mg/dL — ABNORMAL HIGH (ref 65–99)
POTASSIUM: 4 mmol/L (ref 3.5–5.1)
SODIUM: 136 mmol/L (ref 135–145)
Total Bilirubin: 1.6 mg/dL — ABNORMAL HIGH (ref 0.3–1.2)
Total Protein: 8.1 g/dL (ref 6.5–8.1)

## 2015-04-02 LAB — CBC
HCT: 47.1 % — ABNORMAL HIGH (ref 35.0–47.0)
Hemoglobin: 15.5 g/dL (ref 12.0–16.0)
MCH: 29.5 pg (ref 26.0–34.0)
MCHC: 32.9 g/dL (ref 32.0–36.0)
MCV: 89.6 fL (ref 80.0–100.0)
PLATELETS: 175 10*3/uL (ref 150–440)
RBC: 5.26 MIL/uL — AB (ref 3.80–5.20)
RDW: 15.4 % — ABNORMAL HIGH (ref 11.5–14.5)
WBC: 9.7 10*3/uL (ref 3.6–11.0)

## 2015-04-02 LAB — ACETAMINOPHEN LEVEL: Acetaminophen (Tylenol), Serum: <10 ug/mL — ABNORMAL LOW (ref 10–30)

## 2015-04-02 LAB — TSH: TSH: 1.754 u[IU]/mL (ref 0.350–4.500)

## 2015-04-02 LAB — URINALYSIS COMPLETE WITH MICROSCOPIC (ARMC ONLY)
BILIRUBIN URINE: NEGATIVE
Bacteria, UA: NONE SEEN
Glucose, UA: NEGATIVE mg/dL
Hgb urine dipstick: NEGATIVE
KETONES UR: NEGATIVE mg/dL
NITRITE: NEGATIVE
PH: 6 (ref 5.0–8.0)
PROTEIN: NEGATIVE mg/dL
SPECIFIC GRAVITY, URINE: 1.006 (ref 1.005–1.030)

## 2015-04-02 LAB — ETHANOL

## 2015-04-02 LAB — SALICYLATE LEVEL: Salicylate Lvl: <4 mg/dL (ref 2.8–30.0)

## 2015-04-02 MED ORDER — KETOROLAC TROMETHAMINE 30 MG/ML IJ SOLN
30.0000 mg | Freq: Once | INTRAMUSCULAR | Status: AC
  Administered 2015-04-02: 30 mg via INTRAMUSCULAR
  Filled 2015-04-02: qty 1

## 2015-04-02 NOTE — ED Notes (Signed)
Security at bedside and asked pt if she was hiding any pills. Pt denies. Security stepped out of room and I searched pt again for pills. No pills found on pt at this time.

## 2015-04-02 NOTE — ED Notes (Signed)
Lab called for add on 
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
## 2015-04-02 NOTE — ED Notes (Signed)
Patient transported to X-ray 

## 2015-04-02 NOTE — ED Notes (Signed)
Pt to ED accompanied by son in law, son in law states pt recently dx with depression in ED, states last night pt started taking Seroquel and cut her mirtazapine in half, states pt was walking around house all night and did not sleep, verbalized to family SI, pt denies any SI at present
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
## 2015-04-03 DIAGNOSIS — IMO0001 Reserved for inherently not codable concepts without codable children: Secondary | ICD-10-CM

## 2015-04-03 DIAGNOSIS — F322 Major depressive disorder, single episode, severe without psychotic features: Secondary | ICD-10-CM

## 2015-04-03 DIAGNOSIS — F419 Anxiety disorder, unspecified: Secondary | ICD-10-CM

## 2015-04-03 DIAGNOSIS — F329 Major depressive disorder, single episode, unspecified: Secondary | ICD-10-CM

## 2015-04-03 LAB — FOLATE RBC
Folate, RBC: >1439 ng/mL (ref >498)
HEMATOCRIT: 43.1 % (ref 34.0–46.6)

## 2015-04-03 LAB — VITAMIN B12: VITAMIN B 12: 317 pg/mL (ref 180–914)

## 2015-04-03 MED ORDER — DILTIAZEM HCL ER COATED BEADS 240 MG PO CP24
240.0000 mg | ORAL_CAPSULE | Freq: Every day | ORAL | Status: DC
  Administered 2015-04-03 – 2015-04-04 (×2): 240 mg via ORAL
  Filled 2015-04-03 (×3): qty 1

## 2015-04-03 MED ORDER — QUETIAPINE FUMARATE 25 MG PO TABS
25.0000 mg | ORAL_TABLET | Freq: Two times a day (BID) | ORAL | Status: DC
  Filled 2015-04-03 (×3): qty 1

## 2015-04-03 MED ORDER — POTASSIUM CHLORIDE CRYS ER 20 MEQ PO TBCR
20.0000 meq | EXTENDED_RELEASE_TABLET | Freq: Two times a day (BID) | ORAL | Status: DC
  Filled 2015-04-03 (×3): qty 1

## 2015-04-03 MED ORDER — DIGOXIN 125 MCG PO TABS
0.1250 mg | ORAL_TABLET | Freq: Every day | ORAL | Status: DC
  Administered 2015-04-03: 0.125 mg via ORAL
  Filled 2015-04-03 (×3): qty 1

## 2015-04-03 MED ORDER — LORAZEPAM 2 MG/ML IJ SOLN
1.0000 mg | Freq: Once | INTRAMUSCULAR | Status: AC
  Administered 2015-04-03: 1 mg via INTRAMUSCULAR
  Filled 2015-04-03: qty 1

## 2015-04-03 MED ORDER — ZOLPIDEM TARTRATE 5 MG PO TABS
10.0000 mg | ORAL_TABLET | Freq: Every evening | ORAL | Status: DC | PRN
Start: 2015-04-03 — End: 2015-04-04
  Filled 2015-04-03: qty 2

## 2015-04-03 MED ORDER — BUDESONIDE 3 MG PO CPEP
6.0000 mg | ORAL_CAPSULE | Freq: Every day | ORAL | Status: DC
  Filled 2015-04-03: qty 2

## 2015-04-03 MED ORDER — DIAZEPAM 5 MG PO TABS
5.0000 mg | ORAL_TABLET | Freq: Four times a day (QID) | ORAL | Status: DC | PRN
  Administered 2015-04-04: 5 mg via ORAL
  Filled 2015-04-03 (×2): qty 1

## 2015-04-03 MED ORDER — BUDESONIDE 0.5 MG/2ML IN SUSP: 6.0000 mg | Freq: Every day | RESPIRATORY_TRACT | Status: DC

## 2015-04-03 MED ORDER — DONEPEZIL HCL 5 MG PO TABS
5.0000 mg | ORAL_TABLET | Freq: Every day | ORAL | Status: DC
  Filled 2015-04-03: qty 1

## 2015-04-03 MED ORDER — ESCITALOPRAM OXALATE 10 MG PO TABS
20.0000 mg | ORAL_TABLET | Freq: Every day | ORAL | Status: DC
  Filled 2015-04-03 (×4): qty 2

## 2015-04-03 MED ORDER — RIVAROXABAN 20 MG PO TABS
20.0000 mg | ORAL_TABLET | Freq: Every day | ORAL | Status: DC
  Filled 2015-04-03 (×2): qty 1

## 2015-04-03 MED ORDER — PANTOPRAZOLE SODIUM 40 MG PO TBEC
40.0000 mg | DELAYED_RELEASE_TABLET | Freq: Two times a day (BID) | ORAL | Status: DC
  Filled 2015-04-03 (×3): qty 1

## 2015-04-03 MED ORDER — DICYCLOMINE HCL 10 MG PO CAPS
20.0000 mg | ORAL_CAPSULE | Freq: Four times a day (QID) | ORAL | Status: DC
  Filled 2015-04-03 (×3): qty 2

## 2015-04-03 MED ORDER — FUROSEMIDE 40 MG PO TABS
20.0000 mg | ORAL_TABLET | ORAL | Status: DC | PRN
  Administered 2015-04-03: 20 mg via ORAL
  Filled 2015-04-03: qty 1

## 2015-04-03 MED ORDER — PRAVASTATIN SODIUM 10 MG PO TABS
10.0000 mg | ORAL_TABLET | Freq: Every day | ORAL | Status: DC
  Filled 2015-04-03 (×2): qty 1

## 2015-04-03 MED ORDER — MIRTAZAPINE 15 MG PO TABS
15.0000 mg | ORAL_TABLET | Freq: Every day | ORAL | Status: DC
  Filled 2015-04-03 (×2): qty 1

## 2015-04-03 NOTE — ED Notes (Signed)
Patients daughter, [name] [name], called to get an update on the patients condition. Patient gave verbal OK to speak with [name] [name]. [name] [name] was informed that patient is to be transported to [facility] this evening but we are waiting on confirmation that the [place] department has female transportation. [name] [name] voiced concern over the fact that the patient had not had her medication today. I informed [name] [name] that on several occasions that I had tried to get the patient to take her medication and to eat and drink something but patient refused. I informed [name] [name] that patient did take a small sip of Sprite a few minutes ago but would not agree to take her medications.  [name] [name] voiced her understanding and wanted to leave her number in case we needed to get in touch with her before the patient left for [address] [facility].  [name] [name]- [phone]

## 2015-04-03 NOTE — ED Notes (Signed)
Patient is still refusing medications as well as food and fluids. RN spoke with patient and explained the importance of taking her medication as her BP is elevated and patient has not taken her medications today, patient states "just do what you have to do", RN told patient that she was going to get her medication for her to take and patient stated "no, I'm not going to take it"

## 2015-04-03 NOTE — ED Notes (Signed)
Psych md with pt 

## 2015-04-03 NOTE — Consult Note (Signed)
[facility] Face-to-Face Psychiatry Consult   Reason for Consult:  Consult in the emergency room for this 74 year old woman brought in by family with rapid worsening of her anxiety and complaints of shortness of breath. Referred by her primary care doctor. Referring Physician:  Marcelene Butte Patient Identification: [name] [name] MRN:  [id number] Principal Diagnosis: Major depression [facility]) Diagnosis:   Patient Active Problem List   Diagnosis Date Noted  . Major depression ([place]) [F32.9] 04/03/2015  . Anxiety disorder [F41.9] 04/03/2015  . Shortness of breath dyspnea [R06.02] 04/03/2015  . 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 Colitis [K52.9]   . Disease of rectum [K62.9]   . Diarrhea [R19.7] 02/21/2015  . H/O: depression [Z86.59] 09/01/2014  . Type 2 diabetes mellitus ([place]) [E11.9] 03/19/2014  . Atrial fibrillation ([place]) [I48.91] 02/18/2014  . Arteriosclerosis of coronary artery [I25.10] 02/18/2014  . HLD (hyperlipidemia) [E78.5] 02/18/2014  . BP (high blood pressure) [I10] 02/18/2014  . Lung mass [R91.8] 01/23/2014    Total Time spent with patient: 1 hour  Subjective:   [name] [name] is a 74 y.o. female patient admitted with "I'm not breathing right".  HPI:  Information from the patient and the chart. Patient interviewed. Chart reviewed including current intake evaluation past notes, lab studies and vital signs. This 74 year old woman was brought in with rapid worsening of her anxiety and depression. On interview the patient is only partially able to cooperate. She tells me that she feels like she is not breathing right but has a hard time describing it in any more detail. She says it's been going on constantly but she doesn't know for how long. She's not been eating well and not been sleeping well. Her mood is feeling very bad. She will not answer direct questions about suicidality. She is limited in how much other history she can give me and appears in a lot of distress. The intake evaluation  reports that she had recently perhaps been started on a new medicine by her primary care doctor for her depression but it's unclear what that was. Here in the emergency room the patient is reported to of been refusing her usual medications including a inhaler for her breathing. We don't know of any particular new psychosocial stress.  Social history: Patient appears to live with family and the full extent of this is unknown I'm really not able to get much other history. Does seem to be a husband involved.  Medical history: Sees Dr. Doy Hutching. Multiple medical problems including diabetes high blood pressure shortness of breath possible COPD possible heart failure. She is on some relative 1 not clear why that was added.  Substance abuse history: There is no history identified I can find about any substance abuse. It does look like she had possibly recently been prescribed Valium but only 1 a day of the 5 mg.  Past Psychiatric History: Apparently she has had a issue with anxiety and depression identified in the past but we don't have any known history of psychiatric hospitalization denies any history of suicide attempts denies any history of psychosis.  Risk to Self: Suicidal Ideation: No Suicidal Intent: No Is patient at risk for suicide?: No Suicidal Plan?: No Access to Means: No What has been your use of drugs/alcohol within the last 12 months?: None reported or noted How many times?: 0 Other Self Harm Risks: None reported or noted Triggers for Past Attempts: None known Intentional Self Injurious Behavior: None Risk to Others: Homicidal Ideation: No Thoughts of Harm to  Others: No Current Homicidal Intent: No Current Homicidal Plan: No Access to Homicidal Means: No Identified Victim: None Reported History of harm to others?: No Assessment of Violence: None Noted Violent Behavior Description: None Reported Does patient have access to weapons?: No Criminal Charges Pending?: No Does patient  have a court date: No Prior Inpatient Therapy: Prior Inpatient Therapy: No Prior Therapy Dates: n/a Prior Therapy Facilty/Provider(s): n/a Reason for Treatment: n/a Prior Outpatient Therapy: Prior Outpatient Therapy: Yes Prior Therapy Dates: 2005 Prior Therapy Facilty/Provider(s): Dr. Redmond Pulling Reason for Treatment: Depression Does patient have an ACCT team?: No Does patient have Intensive In-House Services?  : No Does patient have [facility] services? : No Does patient have P4CC services?: No  Past Medical History:  Past Medical History  Diagnosis Date  . Shortness of breath dyspnea   . Dysrhythmia     afib  . Coronary artery disease   . Hypertension   . Heart murmur   . Arthritis   . Depression   . Diabetes mellitus without complication ([place])   . IBS (irritable bowel syndrome)   . Atrial fibrillation [facility])     Past Surgical History  Procedure Laterality Date  . Coronary angioplasty    . Eye surgery    . Joint replacement Right     tkr  . Shoulder arthroscopy with rotator cuff repair    . Cataract extraction w/phaco Right 01/13/2015    Procedure: CATARACT EXTRACTION PHACO AND INTRAOCULAR LENS PLACEMENT (IOC);  Surgeon: Birder Robson, MD;  Location: [facility] ORS;  Service: Ophthalmology;  Laterality: Right;  US:01:15.6 AP:19.6 CDE:14.84 LOT PACK #[id number] H  . Colonoscopy with propofol N/A 02/24/2015    Procedure: COLONOSCOPY WITH PROPOFOL;  Surgeon: Lucilla Lame, MD;  Location: [facility] ENDOSCOPY;  Service: Endoscopy;  Laterality: N/A;   Family History:  Family History  Problem Relation Age of Onset  . Alzheimer's disease Mother   . Hypertension Mother   . Stroke Mother   . Alzheimer's disease Father   . Hypertension Father   . Stroke Father   . Diabetes Father    Family Psychiatric  History: Patient is able to tell me that her mother also had nerve problems and possibly had been hospitalized for them Social History:  History  Alcohol Use No     History  Drug Use No     Social History   Social History  . Marital Status: Married    Spouse Name: N/A  . Number of Children: N/A  . Years of Education: N/A   Social History Main Topics  . Smoking status: Former Research scientist (life sciences)  . Smokeless tobacco: Never Used  . Alcohol Use: No  . Drug Use: No  . Sexual Activity: Not Asked   Other Topics Concern  . 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 Butorphanol Other (See Comments)  . Ceftriaxone Other (See Comments)  . Hydrocodone-Acetaminophen Nausea Only  . Levaquin [Levofloxacin In D5w] Other (See Comments)    Muscle pain.  . Metronidazole Other (See  Comments)    GI upset, difficulty breathing  . Oxycodone Other (See Comments)    Altered mental status  . Phenergan [Promethazine Hcl]   . Septra [Sulfamethoxazole-Trimethoprim]   . Sulfamethoxazole Other (See Comments)  . Tetracyclines & Related   . Trimethoprim Other (See Comments)    Labs:  Results for orders placed or performed during the hospital encounter of 04/02/15 (from the past 48 hour(s))  TSH     Status: None   Collection Time: 04/02/15  2:10 PM  Result Value Ref Range   TSH 1.754 0.350 - 4.500 uIU/mL  Comprehensive metabolic panel     Status: Abnormal   Collection Time: 04/02/15  2:11 PM  Result Value Ref Range   Sodium 136 135 - 145 mmol/L   Potassium 4.0 3.5 - 5.1 mmol/L   Chloride 105 101 - 111 mmol/L   CO2 25 22 - 32 mmol/L   Glucose, Bld 105 (H) 65 - 99 mg/dL   BUN 18 6 - 20 mg/dL   Creatinine, Ser 0.91 0.44 - 1.00 mg/dL   Calcium 9.5 8.9 - 10.3 mg/dL   Total Protein  8.1 6.5 - 8.1 g/dL   Albumin 4.5 3.5 - 5.0 g/dL   AST 23 15 - 41 U/L   ALT 20 14 - 54 U/L   Alkaline Phosphatase 86 38 - 126 U/L   Total Bilirubin 1.6 (H) 0.3 - 1.2 mg/dL   GFR calc non Af Amer >60 >60 mL/min   GFR calc Af Amer >60 >60 mL/min    Comment: (NOTE) The eGFR has been calculated using the CKD EPI equation. This calculation has not been validated in all clinical situations. eGFR's persistently <60 mL/min signify possible Chronic Kidney Disease.    Anion gap 6 5 - 15  Ethanol (ETOH)     Status: None   Collection Time: 04/02/15  2:11 PM  Result Value Ref Range   Alcohol, Ethyl (B) <5 <5 mg/dL    Comment:        LOWEST DETECTABLE LIMIT FOR SERUM ALCOHOL IS 5 mg/dL FOR MEDICAL PURPOSES ONLY   Salicylate level     Status: None   Collection Time: 04/02/15  2:11 PM  Result Value Ref Range   Salicylate Lvl <2.9 2.8 - 30.0 mg/dL  Acetaminophen level     Status: Abnormal   Collection Time: 04/02/15  2:11 PM  Result Value Ref Range   Acetaminophen (Tylenol), Serum <10 (L) 10 - 30 ug/mL    Comment:        THERAPEUTIC CONCENTRATIONS VARY SIGNIFICANTLY. A RANGE OF 10-30 ug/mL MAY BE AN EFFECTIVE CONCENTRATION FOR MANY PATIENTS. HOWEVER, SOME ARE BEST TREATED AT CONCENTRATIONS OUTSIDE THIS RANGE. ACETAMINOPHEN CONCENTRATIONS >150 ug/mL AT 4 HOURS AFTER INGESTION AND >50 ug/mL AT 12 HOURS AFTER INGESTION ARE OFTEN ASSOCIATED WITH TOXIC REACTIONS.   CBC     Status: Abnormal   Collection Time: 04/02/15  2:11 PM  Result Value Ref Range   WBC 9.7 3.6 - 11.0 K/uL   RBC 5.26 (H) 3.80 - 5.20 MIL/uL   Hemoglobin 15.5 12.0 - 16.0 g/dL   HCT 47.1 (H) 35.0 - 47.0 %   MCV 89.6 80.0 - 100.0 fL   MCH 29.5 26.0 - 34.0 pg   MCHC 32.9 32.0 - 36.0 g/dL   RDW 15.4 (H) 11.5 - 14.5 %   Platelets 175 150 - 440 K/uL  Urine Drug Screen, Qualitative ([facility] only)     Status: Abnormal   Collection Time: 04/02/15  2:11 PM  Result Value Ref Range   Tricyclic, Ur Screen NONE DETECTED NONE  DETECTED   Amphetamines, Ur Screen NONE DETECTED NONE DETECTED   MDMA (Ecstasy)Ur Screen NONE DETECTED NONE DETECTED   Cocaine Metabolite,Ur [place] NONE DETECTED NONE DETECTED   Opiate, Ur Screen NONE DETECTED NONE DETECTED   Phencyclidine (PCP) Ur S NONE DETECTED NONE DETECTED   Cannabinoid 50 Ng, Ur [place] NONE DETECTED NONE DETECTED   Barbiturates, Ur Screen NONE DETECTED NONE DETECTED   Benzodiazepine, Ur Scrn POSITIVE (A) NONE DETECTED   Methadone Scn, Ur NONE DETECTED NONE DETECTED    Comment: (NOTE) 893  Tricyclics, urine               Cutoff 1000 ng/mL 200  Amphetamines, urine             Cutoff 1000 ng/mL 300  MDMA (Ecstasy), urine           Cutoff 500 ng/mL 400  Cocaine Metabolite, urine       Cutoff 300 ng/mL 500  Opiate, urine                   Cutoff 300 ng/mL 600  Phencyclidine (PCP), urine      Cutoff 25 ng/mL 700  Cannabinoid, urine              Cutoff 50 ng/mL 800  Barbiturates, urine             Cutoff 200 ng/mL 900  Benzodiazepine, urine           Cutoff 200 ng/mL 1000 Methadone, urine                Cutoff 300 ng/mL 1100 1200 The urine drug screen provides only a preliminary, unconfirmed 1300 analytical test result and should not be used for non-medical 1400 purposes. Clinical consideration and professional judgment should 1500 be applied to any positive drug screen result due to possible 1600 interfering substances. A more specific alternate chemical method 1700 must be used in order to obtain a confirmed analytical result.  1800 Gas chromato graphy / mass spectrometry (GC/MS) is the preferred 1900 confirmatory method.   Urinalysis complete, with microscopic     Status: Abnormal   Collection Time: 04/02/15  2:11 PM  Result Value Ref Range   Color, Urine STRAW (A) YELLOW   APPearance CLEAR (A) CLEAR   Glucose, UA NEGATIVE NEGATIVE mg/dL   Bilirubin Urine NEGATIVE NEGATIVE   Ketones, ur NEGATIVE NEGATIVE mg/dL   Specific Gravity, Urine 1.006 1.005 - 1.030   Hgb  urine dipstick NEGATIVE NEGATIVE   pH 6.0 5.0 - 8.0   Protein, ur NEGATIVE NEGATIVE mg/dL   Nitrite NEGATIVE NEGATIVE   Leukocytes, UA TRACE (A) NEGATIVE   RBC / HPF 0-5 0 - 5 RBC/hpf   WBC, UA 0-5 0 - 5 WBC/hpf   Bacteria, UA NONE SEEN NONE SEEN   Squamous Epithelial / LPF 0-5 (A) NONE SEEN  Folate RBC     Status: None   Collection Time: 04/02/15  5:22 PM  Result Value Ref Range   Folate, Hemolysate >620.0 Not Estab. ng/mL   Hematocrit 43.1 34.0 - 46.6 %   Folate, RBC >1439 >498 ng/mL    Comment: (NOTE) Performed At: [facility] [place], [place] [id number] Lindon Romp MD LX:[id number]     Current Facility-Administered Medications  Medication Dose Route Frequency Provider Last Rate Last Dose  . budesonide (ENTOCORT EC) 24 hr capsule  6 mg  6 mg Oral Daily Daymon Larsen, MD      . diazepam (VALIUM) tablet 5 mg  5 mg Oral Q6H PRN Lisa Roca, MD      . dicyclomine (BENTYL) capsule 20 mg  20 mg Oral QID Lisa Roca, MD   20 mg at 04/03/15 1030  . digoxin (LANOXIN) tablet 0.125 mg  0.125 mg Oral Daily Lisa Roca, MD   0.125 mg at 04/03/15 1030  . diltiazem (CARDIZEM CD) 24 hr capsule 240 mg  240 mg Oral Daily Lisa Roca, MD   240 mg at 04/03/15 1030  . donepezil (ARICEPT) tablet 5 mg  5 mg Oral QHS Lisa Roca, MD      . escitalopram (LEXAPRO) tablet 20 mg  20 mg Oral Daily Lisa Roca, MD   20 mg at 04/03/15 1030  . furosemide (LASIX) tablet 20 mg  20 mg Oral PRN Lisa Roca, MD      . LORazepam (ATIVAN) injection 1 mg  1 mg Intramuscular Once Gonzella Lex, MD      . mirtazapine (REMERON) tablet 15 mg  15 mg Oral QHS Lisa Roca, MD      . pantoprazole (PROTONIX) EC tablet 40 mg  40 mg Oral BID Lisa Roca, MD   40 mg at 04/03/15 1030  . potassium chloride SA (K-DUR,KLOR-CON) CR tablet 20 mEq  20 mEq Oral BID Lisa Roca, MD   20 mEq at 04/03/15 1030  . pravastatin (PRAVACHOL) tablet 10 mg  10 mg Oral QHS Lisa Roca, MD      . QUEtiapine  (SEROQUEL) tablet 25 mg  25 mg Oral BID Lisa Roca, MD   25 mg at 04/03/15 1030  . rivaroxaban (XARELTO) tablet 20 mg  20 mg Oral Q supper Lisa Roca, MD      . zolpidem [facility]) tablet 10 mg  10 mg Oral QHS PRN Lisa Roca, MD       Current Outpatient Prescriptions  Medication Sig Dispense Refill  . budesonide (ENTOCORT EC) 3 MG 24 hr capsule Take 6 mg by mouth daily.     . diazepam (VALIUM) 5 MG tablet Take 5 mg by mouth every 6 (six) hours as needed for anxiety.    . dicyclomine (BENTYL) 20 MG tablet Take 1 tablet (20 mg total) by mouth 4 (four) times daily -  before meals and at bedtime. 120 tablet 5  . digoxin (LANOXIN) 0.125 MG tablet Take 0.125 mg by mouth daily.    Marland Kitchen diltiazem (DILACOR XR) 240 MG 24 hr capsule Take 240 mg by mouth daily.    Marland Kitchen donepezil (ARICEPT) 5 MG tablet Take 5 mg by mouth at bedtime.    Marland Kitchen escitalopram (LEXAPRO) 20 MG tablet Take 20 mg by mouth daily.    . furosemide (LASIX) 20 MG tablet Take 1 tablet (20 mg total) by mouth daily as needed. 30 tablet 5  . mirtazapine (REMERON) 15 MG tablet Take 15 mg by mouth at bedtime.    . pantoprazole (PROTONIX) 40 MG tablet Take 1 tablet (40 mg total) by mouth 2 (two) times daily. 60 tablet 5  . potassium chloride SA (K-DUR,KLOR-CON) 20 MEQ tablet Take 1 tablet (20 mEq total) by mouth 2 (two) times daily. 60 tablet 5  . pravastatin (PRAVACHOL) 10 MG tablet Take 10 mg by mouth at bedtime.     Marland Kitchen QUEtiapine (SEROQUEL) 25 MG tablet Take 1 tablet by mouth 2 (two) times daily.    . rivaroxaban (XARELTO) 20  MG TABS tablet Take 20 mg by mouth daily with supper.    . zolpidem (AMBIEN) 10 MG tablet Take 10 mg by mouth at bedtime as needed for sleep.      Musculoskeletal: Strength & Muscle Tone: decreased Gait & Station: unable to stand Patient leans: N/A  Psychiatric Specialty Exam: Review of Systems  Constitutional: Negative.   HENT: Negative.   Eyes: Negative.   Respiratory: Positive for shortness of breath.    Cardiovascular: Negative.   Gastrointestinal: Negative.   Musculoskeletal: Negative.   Skin: Negative.   Neurological: Negative.   Psychiatric/Behavioral: Positive for depression and memory loss. Negative for suicidal ideas, hallucinations and substance abuse. The patient is nervous/anxious and has insomnia.     Blood pressure 159/103, pulse 49, resp. rate 18, height 5' 6"  (1.676 m), weight 68.04 kg (150 lb), SpO2 88 %.Body mass index is 24.22 kg/(m^2).  General Appearance: Casual  Eye Contact::  Minimal  Speech:  Slow  Volume:  Decreased  Mood:  Anxious and Dysphoric  Affect:  Restricted  Thought Process:  Loose  Orientation:  Negative  Thought Content:  Negative  Suicidal Thoughts:  No  Homicidal Thoughts:  No  Memory:  Immediate;   Poor Recent;   Poor Remote;   Poor  Judgement:  Impaired  Insight:  Shallow  Psychomotor Activity:  Decreased  Concentration:  Poor  Recall:  Poor  Fund of Knowledge:Fair  Language: Fair  Akathisia:  No  Handed:  Right  AIMS (if indicated):     Assets:  Resilience  ADL's:  Impaired  Cognition: Impaired,  Mild  Sleep:      Treatment Plan Summary: Daily contact with patient to assess and evaluate symptoms and progress in treatment, Medication management and Plan This 74 year old woman is currently presenting as being intermittently delirious confused but able to report multiple symptoms of recent depression. With constant redirection I was able to get a little history out of her. She seems to be feeling very panicky and having some shortness of breath. She had refused multiple medicines this morning for unclear reasons. I have ordered a injection of a single milligram of Ativan to see if that calms down some of what looks like a panic attack. In the meantime the patient needs hospital level treatment. She is in no state to go home and take care of herself. I have recommended that she be referred to a geriatric psychiatry unit for management of what  is probably a major depression with anxiety symptoms. She can be continued on her current medications as prescribed.  Disposition: Recommend psychiatric Inpatient admission when medically cleared. Supportive therapy provided about ongoing stressors.  John Clapacs 04/03/2015 4:57 PM

## 2015-04-03 NOTE — ED Notes (Signed)
Patients son in law, Lesly Rubenstein, called to speak with nurse because his wife called him upset that patient would be transported via [place] to [place]. I explained to him that per state law if a patient is under IVC they have to be transported by sheriff. Bo also asked why he was no informed when here earlier today that patient had not been taking her medication. I informed him that while patients husband was in the room visiting with her I informed him that patient had not been eating or drinking today and that she was refusing all medication. I informed Lesly Rubenstein that we would evaluate the patient the ensure that she was able to ride in the sherifff car.

## 2015-04-03 NOTE — BH Assessment (Addendum)
Pt. has been accepted to [facility].  Accepting physician is Dr. Launa Grill.  Call report to [phone]. Representative was [employer].  ER Staff Vaughan Basta, ER Sect.; Dr. Marcelene Butte, ER MD & Donald/Angela Patient's Nurse) have been made aware it.    Mayer Camel request, if the patient is unable to arrived to their facility by 11pm. She will need to come tomorrow (04/04/2015).  Pt.'s Family/Support System ([id number]) have been updated as well.

## 2015-04-03 NOTE — ED Notes (Signed)
RN in patients room in attempt to get EKG, Patient kept refusing to let RN do EKG. Patient reassured that she was safe here and that we were trying to help her. Patient stated "just do what you gotta do". EKG was obtained. Patient was offered water and food but refused. Patient stated "just get out of here."

## 2015-04-03 NOTE — ED Notes (Signed)
Patients daughter called to get an update on the patient and status of her going to [place]. Daughter informed that patient is to be transported to [place] this evening. Patients daughter asked about patient condition, I informed her that patient has been refusing all of her medications today as well as food and drinks. Per patient daughter, her husband will be bringing patient clothes before she leaves the ED.

## 2015-04-03 NOTE — ED Notes (Signed)
RN entered room, respirations noted to be equal and unlabored, patient in no acute distress.

## 2015-04-03 NOTE — BH Assessment (Signed)
Per the request of patient's nurse Levada Dy), this writer called the patient's Son-N-Law [phone]). Writer provider him with the phone number and address of the facility. Also, informed him, the patient will transport by [employer] and she will not be in handcuffs, as long as she cooperate and nonviolent. Checked with ER Sectary about the ETA for transport. At this time it's unknown, waiting for a call back. Information forwarded to family.  Family requested they are called before she leaves. They want to bring her clothes to [facility], before she leaves.

## 2015-04-03 NOTE — ED Notes (Signed)
Patient husband present at bedside. Patient is still refusing food and fluids.

## 2015-04-03 NOTE — ED Notes (Signed)
RN in room with patient. Patient is able to sit on the side of the bed without assistance. Patient took 1 small sip of sprite. Patient continues to refuse medications.

## 2015-04-03 NOTE — BH Assessment (Addendum)
Received phone call from [facility] Vaughan Basta) stating the patient will not transport until tomorrow (04/04/2015) at [place] called family([phone]) and updated them.  Writer called [facility] ([phone]) and updated them as well.

## 2015-04-04 MED ORDER — LORAZEPAM 2 MG/ML IJ SOLN: 1.0000 mg | Freq: Once | INTRAMUSCULAR | Status: DC

## 2015-04-04 NOTE — ED Notes (Signed)
Daughter Chana Bode notified that pt would be transferred today. That we would not know time of transfer until sheriff was here to pick up pt. That I would call and let her know when pt left from here.

## 2015-04-04 NOTE — BHH Counselor (Signed)
Sheriffs department contacted [place] for transport to [place]. Writer reviewed chart to verify that Pt was transported earlier today and is not currently @ [facility].

## 2015-04-04 NOTE — ED Notes (Signed)
Daughter Malachy Mood notified that sheriff was here for transport.

## 2015-04-04 NOTE — ED Notes (Signed)
Report called to dianna @ [facility]

## 2015-04-04 NOTE — ED Notes (Signed)
Attempted for +/- 45 minutes to encourage pt to take morning meds without success. Pt did take cardizem. See emar. Pt stated that she did not know if they were the right meds. I repeatedly named the meds to be given, why they were being given, and need to take them. Pt intermittantly talked about the green car at the end of her bed and that her family was in the next room. Dr Jacqualine Code notified.

## 2015-04-04 NOTE — ED Provider Notes (Signed)
-----------------------------------------   5:50 AM on 04/04/2015 -----------------------------------------   Blood pressure 157/90, pulse 96, resp. rate 31, height 5\' 6"  (1.676 m), weight 150 lb (68.04 kg), SpO2 96 %.  The patient had no acute events since last update.  Calm and cooperative at this time.  Patient is accepted for transfer to another facility, awaiting availability of transport unit.   Carrie Mew, MD 04/04/15 [phone]

## 2015-04-04 NOTE — ED Notes (Signed)
RN at bedside with patient, patient talking to "Bo" asking him to come and fix the TV, patient is mumbling about a "filtration system". Patient appears agitated and is picking at her socks.

## 2015-04-04 NOTE — ED Notes (Signed)
Pt. Transported with upper and lower teeth in mouth, eye glasses on, red white and black soft belongings bag. Daughter notified about belongings.

## 2015-06-24 DEATH — deceased

## 2016-02-12 IMAGING — MR MR ABDOMEN WO/W CM
10 of 18 series · 26 of 48 positions shown · IV contrast (14ml Multihance)
Comparison: CT and ultrasound of earlier today.

CLINICAL DATA: CT and ultrasound demonstrating hepatic dome lesion.
Abdominal pain for 3 weeks. Severe diarrhea.

EXAM:
MRI ABDOMEN WITHOUT AND WITH CONTRAST
TECHNIQUE: Multiplanar multisequence MR imaging of the abdomen was performed
both before and after the administration of intravenous contrast.
CONTRAST:  14mL MULTIHANCE GADOBENATE DIMEGLUMINE 529 MG/ML IV SOLN

[Series 2: T2 · coronal · 8.0mm · 0.78mm/px · 2 of 21 slices shown]
[im 1/21]
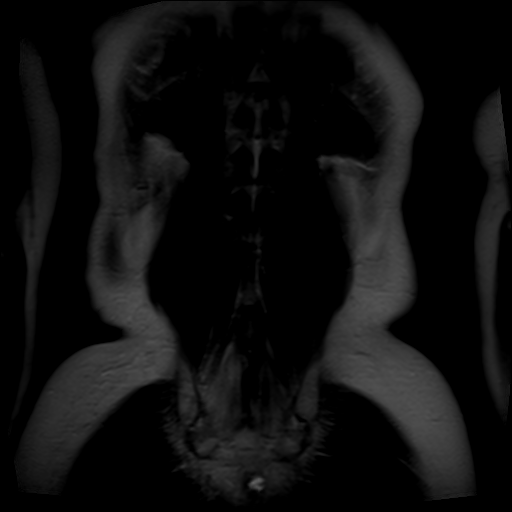
[im 21/21]
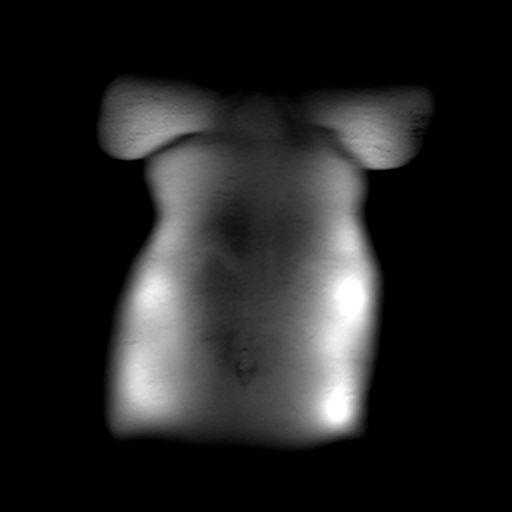

[Series 3: T2 fat-sat · axial · 8.0mm · 0.74mm/px · 1 of 27 slices shown]
[im 1/27]
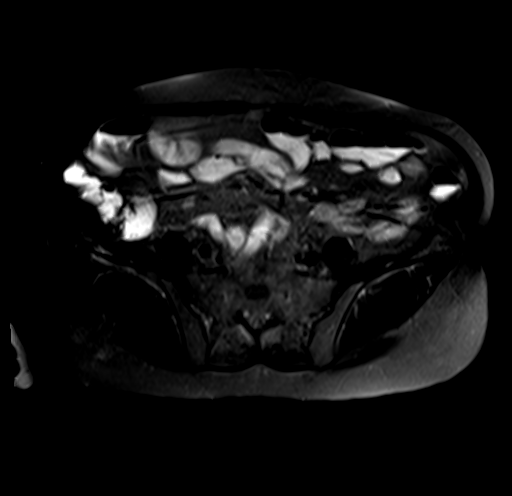

[Series 6: DWI · axial · 8.0mm · 1.98mm/px · z∈[-94,+156]mm · 3 of 81 slices shown]
[im 1/81]
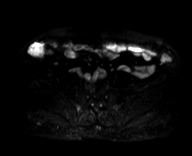
[im 41/81]
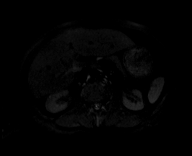
[im 81/81]
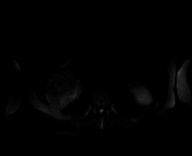

[Series 7: ax dwi_adc · axial · 8.0mm · 1.98mm/px · 1 of 27 slices shown]
[im 1/27]
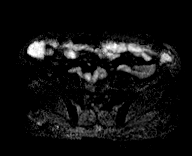

[Series 8: bSSFP · axial · 8.0mm · 0.74mm/px · 1 of 27 slices shown]
[im 1/27]
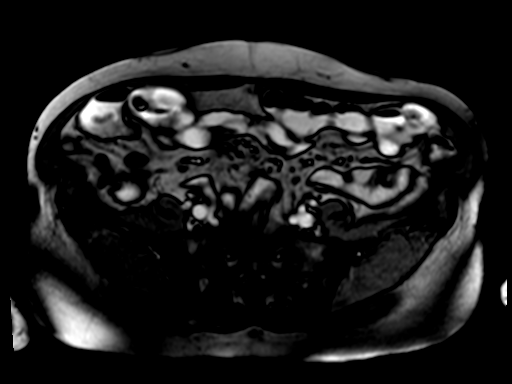

[Series 9: T1 dynamic fat-sat · axial · non-contrast · 2.5mm · 0.74mm/px · z∈[-88,+150]mm · 4 of 96 slices shown (1 of 2)]
[im 1/96]
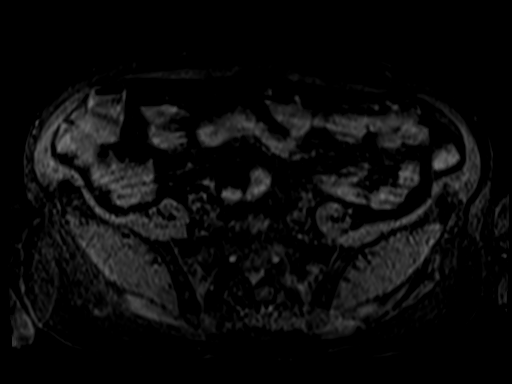
[im 32/96]
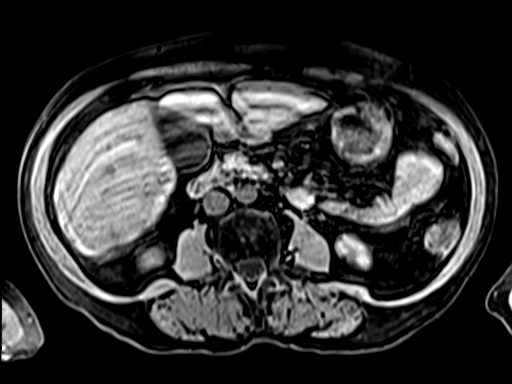
[im 64/96]
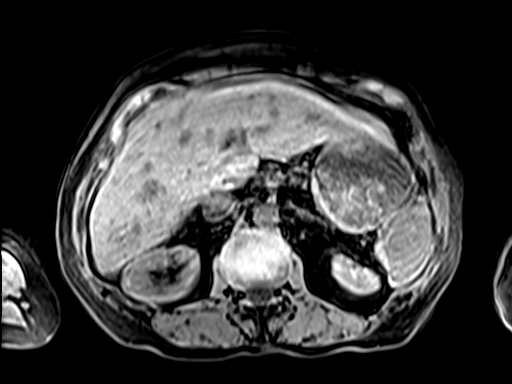
[im 96/96]
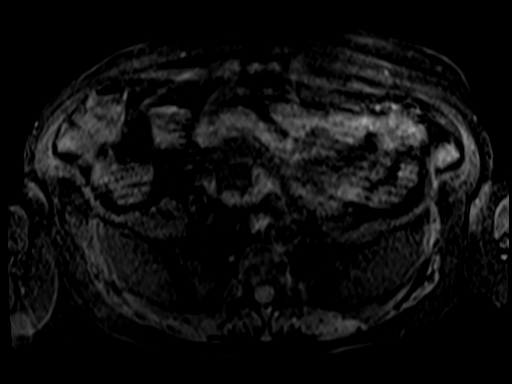

[Series 10: T1 dynamic fat-sat · axial · 2.5mm · 0.74mm/px · z∈[-88,+150]mm · 4 of 96 slices shown (2 of 2)]
[im 1/96]
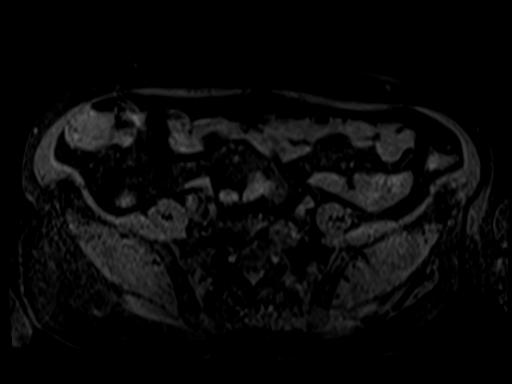
[im 32/96]
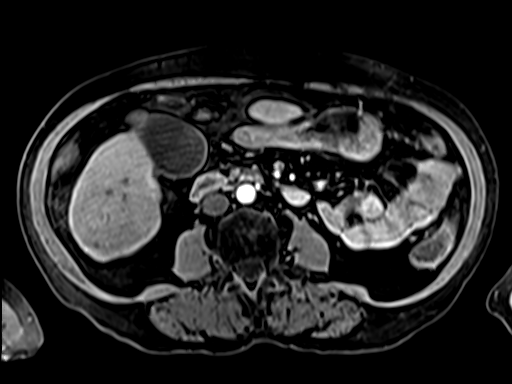
[im 64/96]
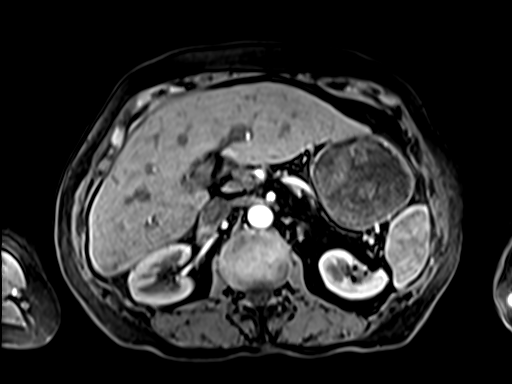
[im 96/96]
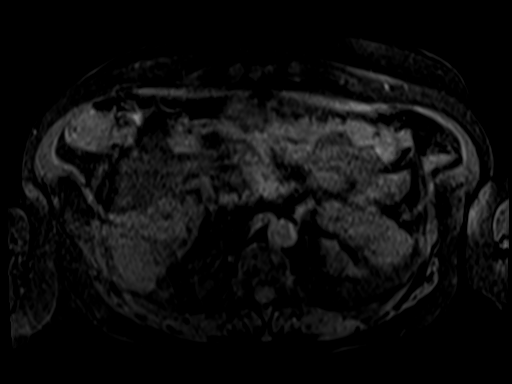

[Series 11: T1 dynamic fat-sat post-contrast · axial · 2.5mm · 0.74mm/px · z∈[-88,+150]mm · 4 of 96 slices shown (1 of 3)]
[im 1/96]
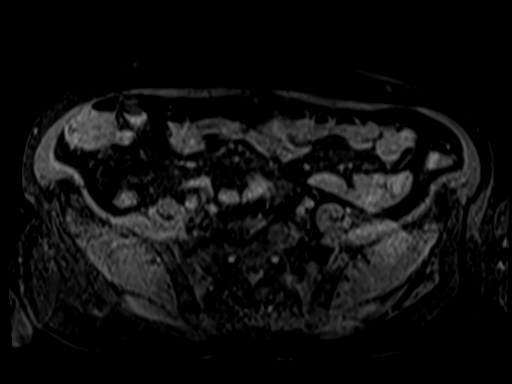
[im 32/96]
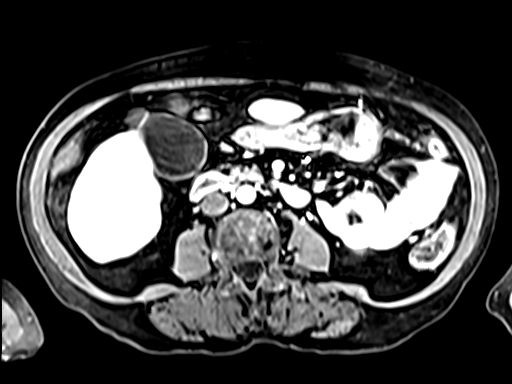
[im 64/96]
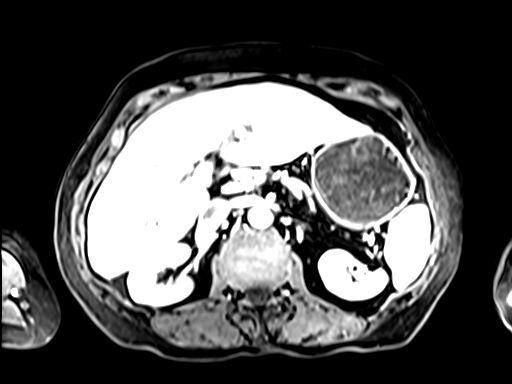
[im 96/96]
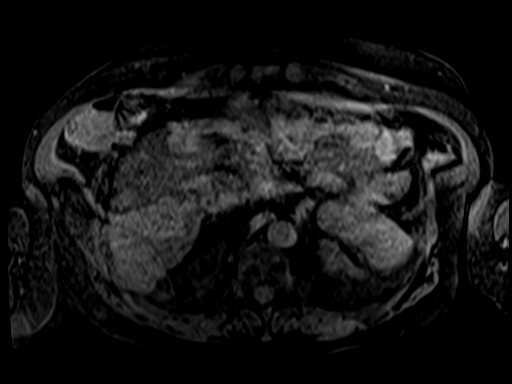

[Series 12: T1 dynamic fat-sat post-contrast · axial · 2.5mm · 0.74mm/px · z∈[-88,+150]mm · 4 of 96 slices shown (2 of 3)]
[im 1/96]
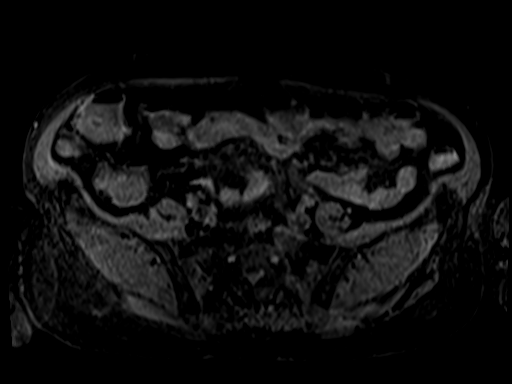
[im 32/96]
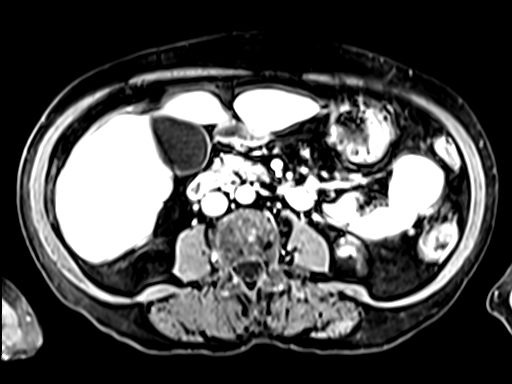
[im 64/96]
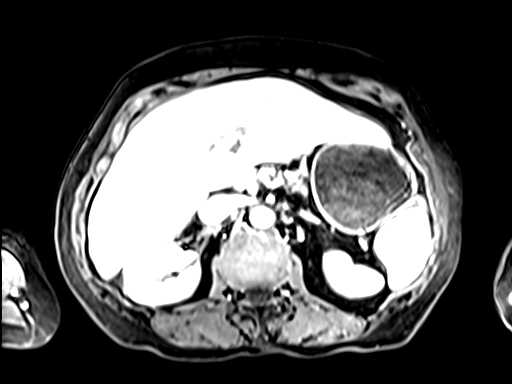
[im 96/96]
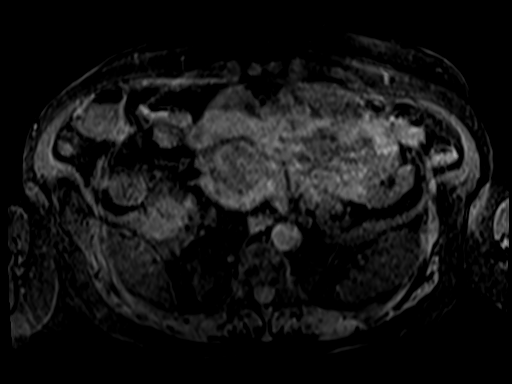

[Series 13: T1 dynamic fat-sat post-contrast · coronal · 2.5mm · 0.82mm/px · 2 of 80 slices shown (3 of 3)]
[im 1/80]
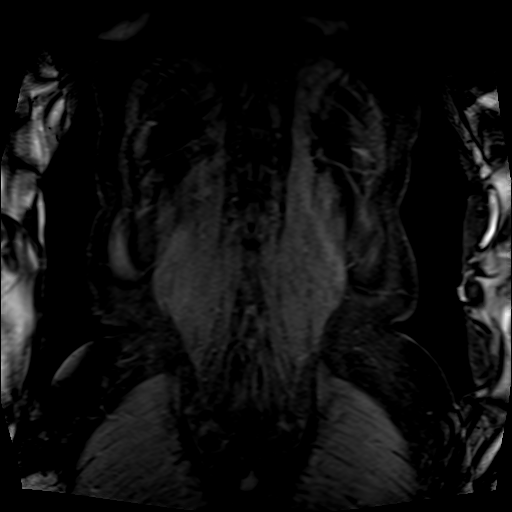
[im 40/80]
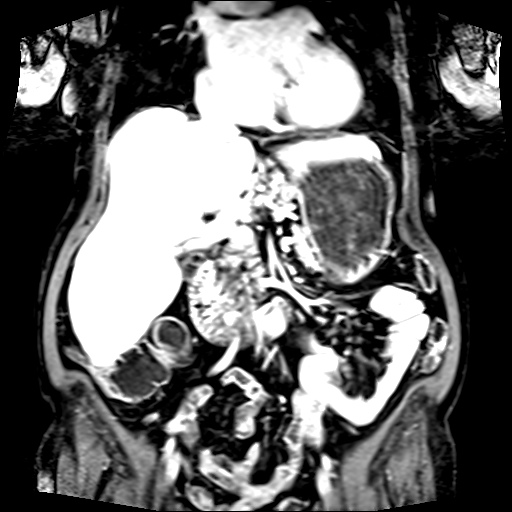

[26 of 48 positions shown; findings below may reference images not displayed]

FINDINGS: Portions of exam are mildly motion degraded.

Lower chest: Normal heart size without pericardial or pleural
effusion.

Hepatobiliary: Hepatomegaly, 20.9 cm craniocaudal. Irregular hepatic
capsule. Corresponding the CT abnormality, within the subcapsular
right perihepatic space is a 3.4 x 2.0 x 3.9 cm fluid signal
structure without post-contrast enhancement. No suspicious
parenchymal liver lesions. Normal gallbladder, without biliary
ductal dilatation.

Pancreas: Normal, without mass or ductal dilatation.

Spleen: Normal in size, without focal abnormality.

Adrenals/Urinary Tract: Normal adrenal glands. Normal right kidney.
Scarred upper and interpolar left kidney. No hydronephrosis.

Stomach/Bowel: Normal stomach and abdominal bowel loops.

Vascular/Lymphatic: Normal caliber of the aorta and branch vessels.
Patent hepatic veins and portal vein. No retroperitoneal or
retrocrural adenopathy.

Other: No ascites.

Musculoskeletal: Convex right lumbar spine curvature is mild.
IMPRESSION: 1. The CT abnormality represents a a perihepatic, subcapsular simple
fluid collection which could be related to remote trauma or biopsy.
No suspicious parenchymal liver lesion. Hepatomegaly. Suspicion of
mild cirrhosis.
2. Otherwise, motion degraded exam.

## 2016-07-20 IMAGING — US US ABDOMEN LIMITED
1 series · 13 of 25 positions shown · non-contrast
Comparison: CT abdomen and pelvis February 21, 2015

CLINICAL DATA: Right upper quadrant pain with diarrhea for 1 day

EXAM:
US ABDOMEN LIMITED - RIGHT UPPER QUADRANT

[Series 1: us abdomen limited · 0.18mm/px · 13 of 90 slices shown]
[im 1/90]
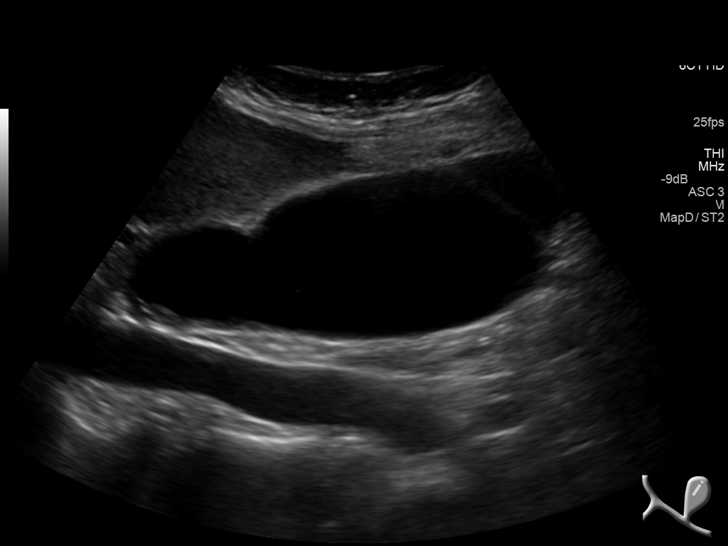
[im 8/90]
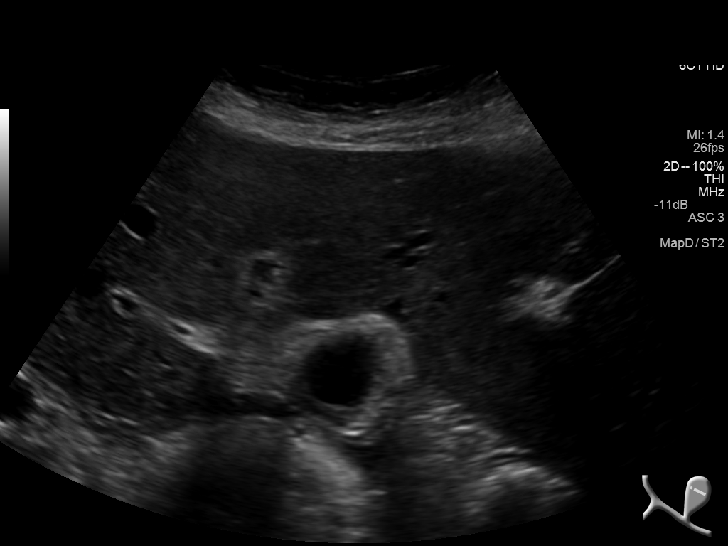
[im 15/90]
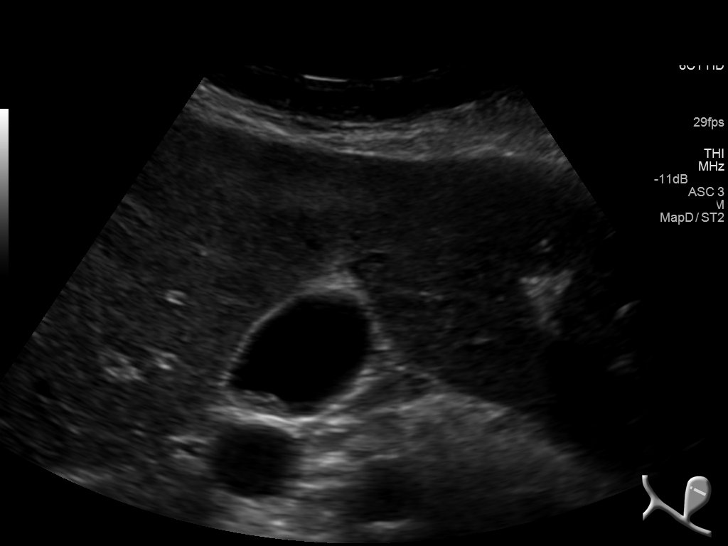
[im 23/90]
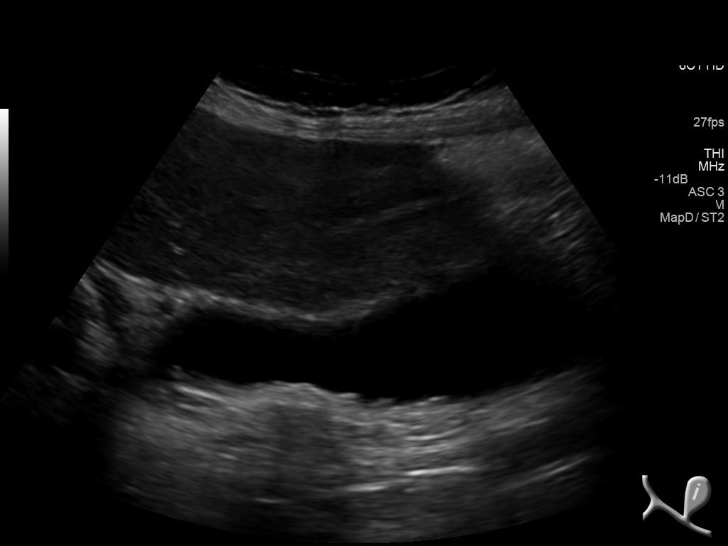
[im 30/90]
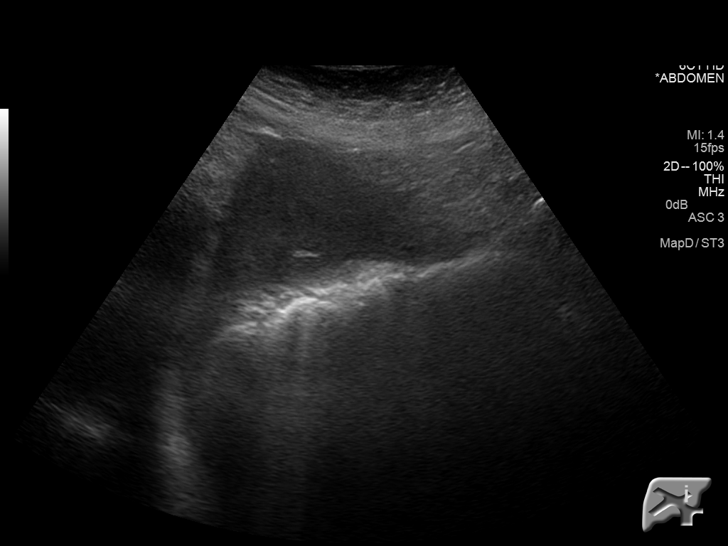
[im 38/90]
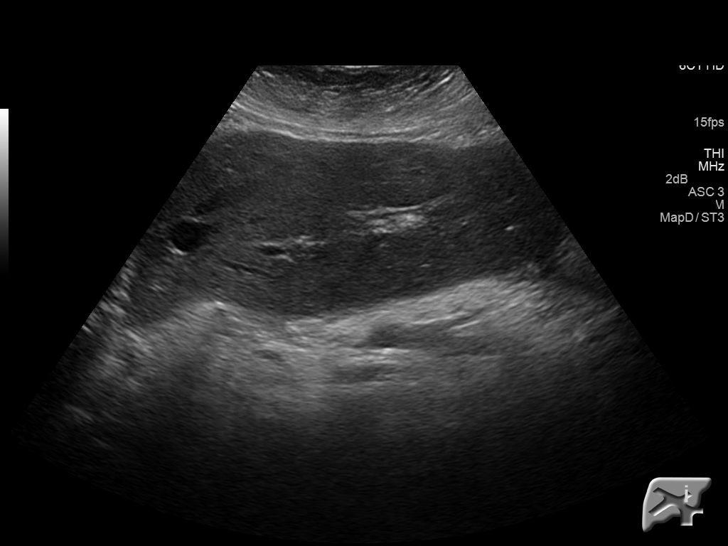
[im 45/90]
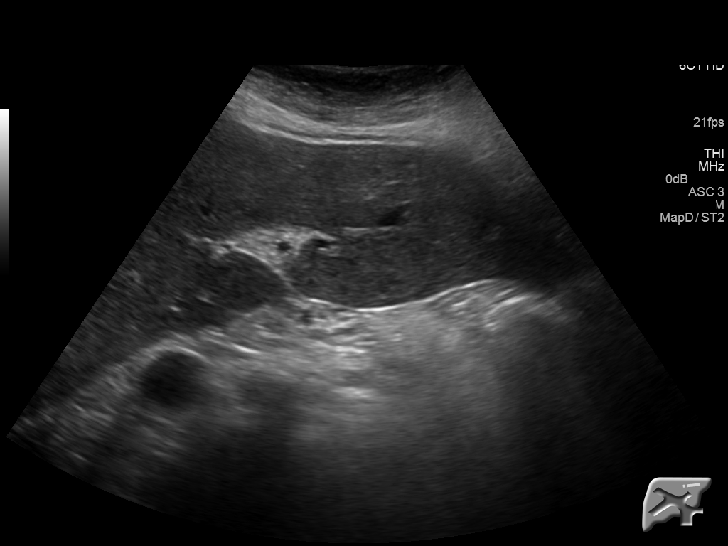
[im 52/90]
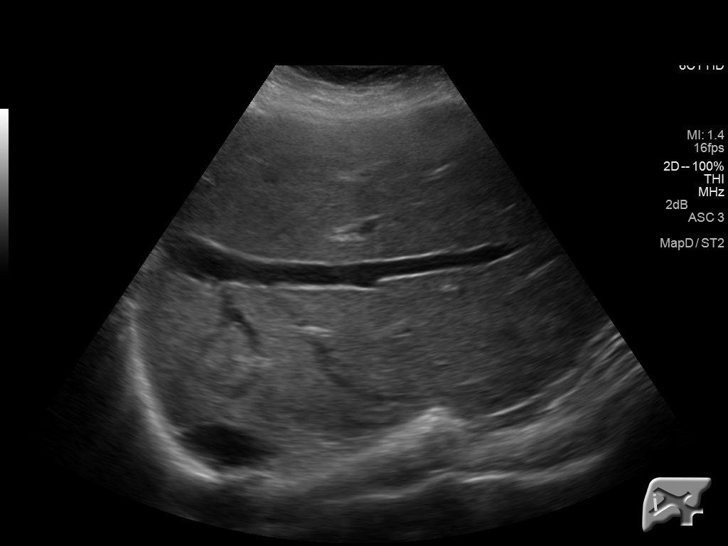
[im 60/90]
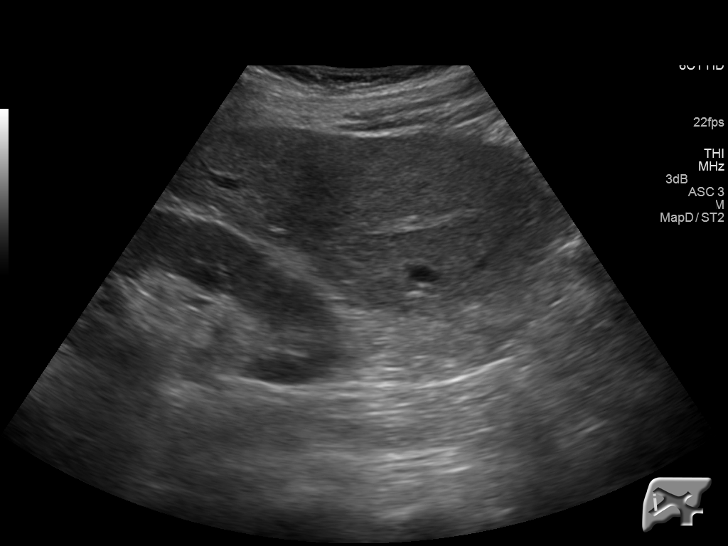
[im 67/90]
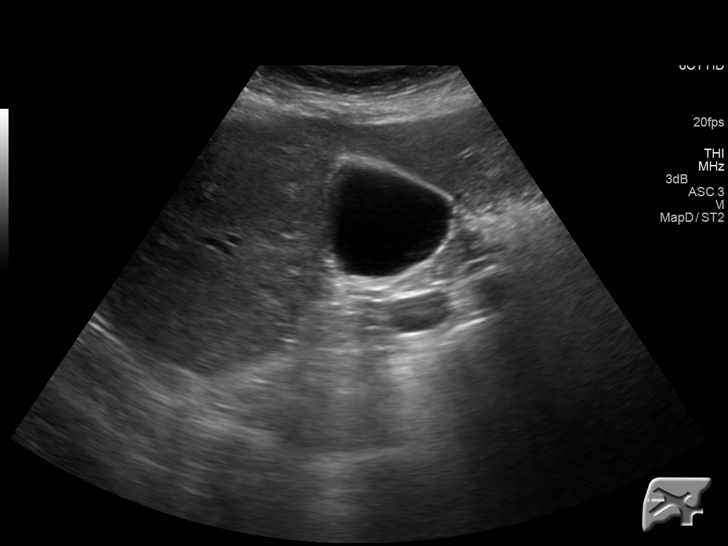
[im 75/90]
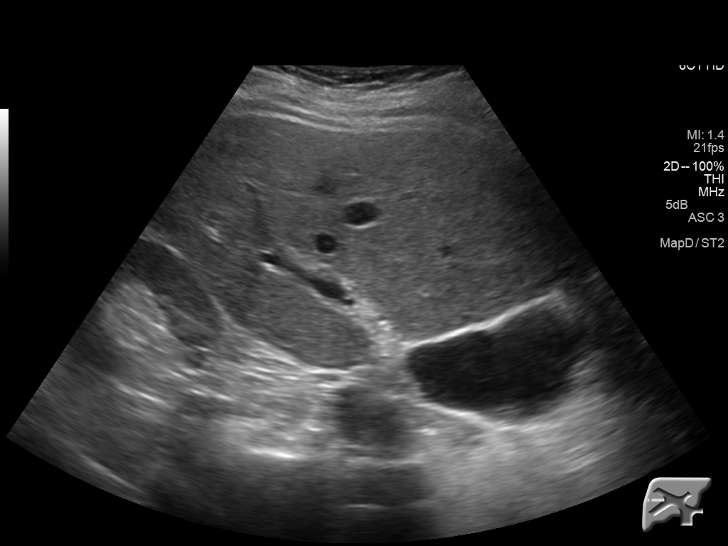
[im 82/90]
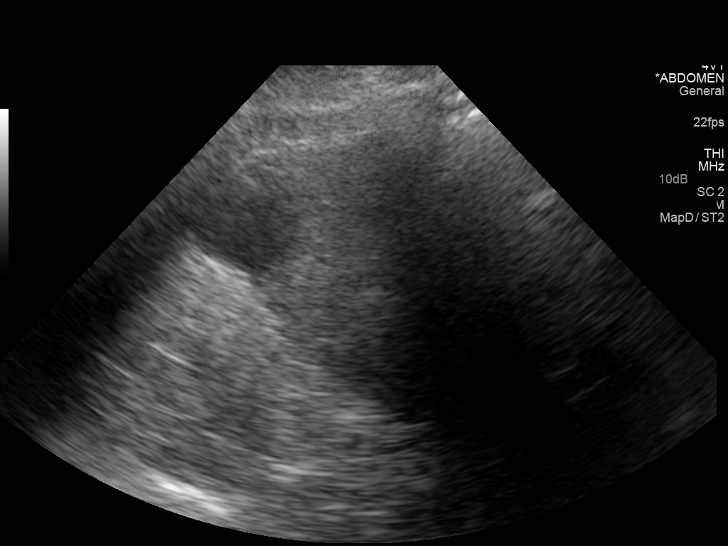
[im 90/90]
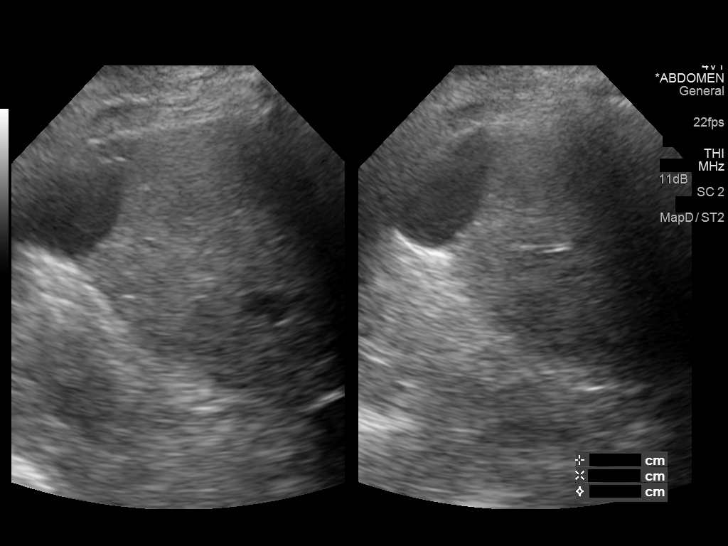

[13 of 25 positions shown; findings below may reference images not displayed]

FINDINGS: Gallbladder:

Within the gallbladder, there are multiple small echogenic foci
which move and shadow consistent with cholelithiasis none. The
largest of these foci measures 9 mm. There is no gallbladder wall
thickening or pericholecystic fluid. No sonographic Murphy sign
noted.

Common bile duct:

Diameter: 5 mm. No intrahepatic or extrahepatic biliary duct
dilatation.

Liver:

There is a hypoechoic mass along the posterior superior aspect of
the right lobe of the liver measuring 3.0 x 2.1 x 2.6 cm
corresponding to the lesions seen by CT. No other focal liver
lesions are identified on this study. The liver echogenicity is
mildly increased with a subtly nodular contour to the liver
suggesting underlying cirrhosis.
IMPRESSION: Cholelithiasis.  No biliary duct dilatation.

Mass along the dome of the liver, hypoechoic but not indicative of a
simple cyst, corresponding to the lesions seen earlier in the day on
CT. The appearance the liver suggests underlying parenchymal
disease. The sensitivity of ultrasound for focal liver lesions is
diminished in this circumstance. Correlation with pre and
post-contrast liver MR to further evaluate the lesion arising in the
liver dome region is advisable.
